# Patient Record
Sex: Female | Born: 1960
Health system: Southern US, Community
[De-identification: ages and names within clinical notes are randomized; demographics above are authoritative.]

## PROBLEM LIST (undated history)

## (undated) DIAGNOSIS — M419 Scoliosis, unspecified: Secondary | ICD-10-CM

## (undated) DIAGNOSIS — E785 Hyperlipidemia, unspecified: Secondary | ICD-10-CM

## (undated) DIAGNOSIS — E669 Obesity, unspecified: Secondary | ICD-10-CM

## (undated) HISTORY — DX: Hyperlipidemia, unspecified: E78.5

## (undated) HISTORY — DX: Obesity, unspecified: E66.9

## (undated) HISTORY — PX: CHOLECYSTECTOMY: SHX55

## (undated) HISTORY — DX: Scoliosis, unspecified: M41.9

---
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
## 2008-10-14 ENCOUNTER — Ambulatory Visit: Payer: Self-pay | Admitting: Cardiology

## 2009-01-14 ENCOUNTER — Ambulatory Visit: Payer: Self-pay | Admitting: Cardiology

## 2009-01-14 LAB — CONVERTED CEMR LAB
ALT: 29 units/L (ref 0–35)
AST: 29 units/L (ref 0–37)
Albumin: 4 g/dL (ref 3.5–5.2)
Cholesterol: 248 mg/dL (ref 0–200)
Hgb A1c MFr Bld: 7.2 % — ABNORMAL HIGH (ref 4.6–6.0)
Total Protein: 6.8 g/dL (ref 6.0–8.3)
Triglycerides: 1038 mg/dL (ref 0–149)

## 2009-01-20 ENCOUNTER — Ambulatory Visit: Payer: Self-pay | Admitting: Cardiology

## 2009-06-22 ENCOUNTER — Encounter (INDEPENDENT_AMBULATORY_CARE_PROVIDER_SITE_OTHER): Payer: Self-pay | Admitting: *Deleted

## 2009-07-23 ENCOUNTER — Ambulatory Visit: Payer: Self-pay | Admitting: Cardiology

## 2009-07-25 DIAGNOSIS — M4120 Other idiopathic scoliosis, site unspecified: Secondary | ICD-10-CM | POA: Insufficient documentation

## 2009-07-25 DIAGNOSIS — F17200 Nicotine dependence, unspecified, uncomplicated: Secondary | ICD-10-CM

## 2009-07-25 DIAGNOSIS — E669 Obesity, unspecified: Secondary | ICD-10-CM

## 2009-07-27 ENCOUNTER — Telehealth: Payer: Self-pay | Admitting: Cardiology

## 2009-07-27 LAB — CONVERTED CEMR LAB
ALT: 20 units/L (ref 0–35)
AST: 21 units/L (ref 0–37)
Alkaline Phosphatase: 65 units/L (ref 39–117)
Bilirubin, Direct: 0 mg/dL (ref 0.0–0.3)
Total Bilirubin: 0.8 mg/dL (ref 0.3–1.2)

## 2009-07-30 ENCOUNTER — Ambulatory Visit: Payer: Self-pay | Admitting: Cardiology

## 2009-07-30 DIAGNOSIS — E783 Hyperchylomicronemia: Secondary | ICD-10-CM

## 2009-09-03 ENCOUNTER — Ambulatory Visit: Payer: Self-pay | Admitting: Cardiology

## 2009-09-08 LAB — CONVERTED CEMR LAB
ALT: 21 units/L (ref 0–35)
AST: 23 units/L (ref 0–37)
Cholesterol: 195 mg/dL (ref 0–200)
Total Bilirubin: 0.6 mg/dL (ref 0.3–1.2)
Total CHOL/HDL Ratio: 8

## 2009-09-10 ENCOUNTER — Ambulatory Visit: Payer: Self-pay | Admitting: Cardiology

## 2009-12-24 ENCOUNTER — Encounter: Payer: Self-pay | Admitting: Cardiology

## 2010-03-14 ENCOUNTER — Telehealth: Payer: Self-pay | Admitting: Cardiology

## 2010-03-17 ENCOUNTER — Telehealth: Payer: Self-pay | Admitting: Cardiology

## 2010-03-18 ENCOUNTER — Ambulatory Visit: Payer: Self-pay | Admitting: Cardiology

## 2010-03-22 ENCOUNTER — Ambulatory Visit: Payer: Self-pay | Admitting: Cardiology

## 2010-03-22 DIAGNOSIS — IMO0001 Reserved for inherently not codable concepts without codable children: Secondary | ICD-10-CM | POA: Insufficient documentation

## 2010-03-22 DIAGNOSIS — E1165 Type 2 diabetes mellitus with hyperglycemia: Secondary | ICD-10-CM

## 2010-03-22 LAB — CONVERTED CEMR LAB
Albumin: 3.8 g/dL (ref 3.5–5.2)
Alkaline Phosphatase: 98 units/L (ref 39–117)
BUN: 8 mg/dL (ref 6–23)
Chloride: 103 meq/L (ref 96–112)
Cholesterol: 176 mg/dL (ref 0–200)
Direct LDL: 51.1 mg/dL
Potassium: 3.7 meq/L (ref 3.5–5.1)
Total CHOL/HDL Ratio: 6
Triglycerides: 902 mg/dL — ABNORMAL HIGH (ref 0.0–149.0)
VLDL: 180.4 mg/dL — ABNORMAL HIGH (ref 0.0–40.0)

## 2010-04-15 ENCOUNTER — Ambulatory Visit: Payer: Self-pay | Admitting: Internal Medicine

## 2010-04-15 DIAGNOSIS — Z87891 Personal history of nicotine dependence: Secondary | ICD-10-CM

## 2010-04-15 LAB — CONVERTED CEMR LAB
Creatinine,U: 131.9 mg/dL
Hgb A1c MFr Bld: 8.9 % — ABNORMAL HIGH (ref 4.6–6.5)
Microalb, Ur: 32.9 mg/dL — ABNORMAL HIGH (ref 0.0–1.9)

## 2010-06-28 ENCOUNTER — Telehealth: Payer: Self-pay | Admitting: Cardiology

## 2010-06-30 ENCOUNTER — Ambulatory Visit: Payer: Self-pay | Admitting: Cardiology

## 2010-06-30 DIAGNOSIS — E119 Type 2 diabetes mellitus without complications: Secondary | ICD-10-CM

## 2010-07-11 ENCOUNTER — Telehealth: Payer: Self-pay | Admitting: Internal Medicine

## 2010-08-05 ENCOUNTER — Ambulatory Visit: Payer: Self-pay | Admitting: Internal Medicine

## 2010-08-05 DIAGNOSIS — K219 Gastro-esophageal reflux disease without esophagitis: Secondary | ICD-10-CM | POA: Insufficient documentation

## 2010-08-19 ENCOUNTER — Encounter: Payer: Self-pay | Admitting: Internal Medicine

## 2010-09-23 ENCOUNTER — Ambulatory Visit: Payer: Self-pay | Admitting: Cardiology

## 2010-09-23 DIAGNOSIS — E109 Type 1 diabetes mellitus without complications: Secondary | ICD-10-CM | POA: Insufficient documentation

## 2010-09-23 DIAGNOSIS — E785 Hyperlipidemia, unspecified: Secondary | ICD-10-CM | POA: Insufficient documentation

## 2010-09-29 LAB — CONVERTED CEMR LAB
AST: 48 units/L — ABNORMAL HIGH (ref 0–37)
Alkaline Phosphatase: 75 units/L (ref 39–117)
Bilirubin, Direct: 0.1 mg/dL (ref 0.0–0.3)
Total Bilirubin: 0.5 mg/dL (ref 0.3–1.2)
Total CHOL/HDL Ratio: 7

## 2010-10-03 ENCOUNTER — Ambulatory Visit: Payer: Self-pay | Admitting: Cardiology

## 2010-10-17 ENCOUNTER — Telehealth: Payer: Self-pay | Admitting: Internal Medicine

## 2010-12-20 NOTE — Letter (Signed)
Summary: [facility]   Imported By: Lester [place] 08/23/2010 10:34:48  _____________________________________________________________________  External Attachment:    Type:   Image     Comment:   External Document

## 2010-12-20 NOTE — Progress Notes (Signed)
Summary: blood work prior to appt on 8/11   Phone Note Call from Patient Call back at [facility] Phone [phone]   Caller: Patient Reason for Call: Talk to Nurse Details for Reason: pt would like to have blood work prior to appt on 8/11. Initial call taken by: Lorne Skeens,  June 28, 2010 8:26 AM  Follow-up for Phone Call        left message on machine. Mylo Red RN     Appended Document: blood work prior to appt on 8/11  Reviewed Juanito Doom, MD

## 2010-12-20 NOTE — Assessment & Plan Note (Signed)
Summary: 3 MO ROV /NWS  #  RS' BUMP APPT 9-2 /NWS   Vital Signs:  Patient profile:   50 year old female Height:      62.5 inches (158.75 cm) Weight:      175.0 pounds (79.55 kg) O2 Sat:      98 % on Room air Temp:     97.9 degrees F (36.61 degrees C) oral Pulse rate:   77 / minute BP sitting:   112 / 66  (left arm) Cuff size:   regular  Vitals Entered By: Orlan Leavens RMA (August 05, 2010 2:32 PM)  O2 Flow:  Room air CC: 3 month follow-up Is Patient Diabetic? Yes Did you bring your meter with you today? No Pain Assessment Patient in pain? no        Primary Care Provider:  Newt Lukes MD  CC:  3 month follow-up.  History of Present Illness: here for followup  1) DM2 - reports compliance with ongoing medical treatment and no changes in medication dose or frequency. denies adverse side effects related to current therapy. tries to follow carb diet - no sig weight losss yet -  2) hyperlipidemia, esp hypertriglcerides - reports compliance with ongoing medical treatment and no changes in medication dose or frequency. denies adverse side effects related to current therapy.  follows with cards  3) hx scoli - s/p harrington rod surg 1977 - no daily back pain - does not follow with any local ortho for same   Contraindications/Deferment of Procedures/Staging:    Test/Procedure: FLU VAX    Reason for deferment: patient declined   Clinical Review Panels:  Lipid Management   Cholesterol:  176 (03/18/2010)   LDL (bad choesterol):  DEL (01/14/2009)   HDL (good cholesterol):  31.00 (03/18/2010)  Diabetes Management   HgBA1C:  8.9 (04/15/2010)   Creatinine:  0.7 (03/18/2010)   Current Medications (verified): 1)  Vitamin D 1000 Unit Tabs (Cholecalciferol) .Marland Kitchen.. 1 Tab Once Daily 2)  Aspirin 81 Mg Tbec (Aspirin) .Marland Kitchen.. 1 Tab Qd 3)  Fenofibrate 160 Mg Tabs (Fenofibrate) .Marland Kitchen.. 1 Tab Once Daily 4)  Probiotic .... As Directed 5)  Ra Fish Oil 1000 Mg Caps (Omega-3 Fatty Acids)  .Marland Kitchen.. 1 Two Times A Day 6)  Flax Seed Oil .Marland Kitchen.. 1 Teaspoon Once Daily 7)  Metformin Hcl 500 Mg Xr24h-Tab (Metformin Hcl) .... 2 By Mouth Two Times A Day 8)  Lisinopril 10 Mg Tabs (Lisinopril) .Marland Kitchen.. 1 By Mouth Once Daily 9)  Onetouch Ultra Test  Strp (Glucose Blood) .... Check Blood Sugars Two Times A Day 10)  Onetouch Lancets  Misc (Lancets) .... Use Two Times A Day  Allergies (verified): 1)  ! Prednisone  Past History:  Past Medical History: Diabetes mellitus, type 2 HYPERLIPIDEMIA-MIXED OBESITY SCOLIOSIS s/p remote harrington rod surg 1977  MD roster: cards - [facility] - [facility] - lowe ([facility])  Review of Systems  The patient denies fever, chest pain, syncope, and peripheral edema.    Physical Exam  General:  alert, well-developed, well-nourished, and cooperative to examination.    Lungs:  normal respiratory effort, no intercostal retractions or use of accessory muscles; normal breath sounds bilaterally - no crackles and no wheezes.    Heart:  normal rate, regular rhythm, no murmur, and no rub. BLE without edema.    Impression & Recommendations:  Problem # 1:  DIABETES MELLITUS, TYPE II (ICD-250.00)  check a1c - add second agent if still poor control Her updated  medication list for this problem includes:    Aspirin 81 Mg Tbec (Aspirin) .Marland Kitchen... 1 tab qd    Metformin Hcl 500 Mg Xr24h-tab (Metformin hcl) .Marland Kitchen... 2 by mouth two times a day    Lisinopril 10 Mg Tabs (Lisinopril) .Marland Kitchen... 1 by mouth once daily  Labs Reviewed: Creat: 0.7 (03/18/2010)    Reviewed HgBA1c results: 8.9 (04/15/2010)  7.2 (01/14/2009)  Orders: TLB-A1C / Hgb A1C (Glycohemoglobin) (83036-A1C) Ophthalmology Referral (Ophthalmology)  Problem # 2:  HYPERLIPIDEMIA TYPE I / IV (ICD-272.3)  Her updated medication list for this problem includes:    Fenofibrate 160 Mg Tabs (Fenofibrate) .Marland Kitchen... 1 tab once daily  cards asked her to add fish oil 1 g twice a day. She is doing the best she can with  her diet per her history. She does not drink alcohol. Her triglycerides in the past have been as high as 1500. I do not think she tolerate Niaspan and with her diabetes is contraindicated at this point.  Problem # 3:  GERD (ICD-530.81)  Her updated medication list for this problem includes:    Omeprazole 20 Mg Cpdr (Omeprazole) .Marland Kitchen... 1 by mouth once daily  Orders: Prescription Created Electronically [id number])  Complete Medication List: 1)  Vitamin D 1000 Unit Tabs (Cholecalciferol) .Marland Kitchen.. 1 tab once daily 2)  Aspirin 81 Mg Tbec (Aspirin) .Marland Kitchen.. 1 tab qd 3)  Fenofibrate 160 Mg Tabs (Fenofibrate) .Marland Kitchen.. 1 tab once daily 4)  Probiotic  .... As directed 5)  Ra Fish Oil 1000 Mg Caps (Omega-3 fatty acids) .Marland Kitchen.. 1 two times a day 6)  Flax Seed Oil  .Marland Kitchen.. 1 teaspoon once daily 7)  Metformin Hcl 500 Mg Xr24h-tab (Metformin hcl) .... 2 by mouth two times a day 8)  Lisinopril 10 Mg Tabs (Lisinopril) .Marland Kitchen.. 1 by mouth once daily 9)  Onetouch Ultra Test Strp (Glucose blood) .... Check blood sugars two times a day 10)  Onetouch Lancets Misc (Lancets) .... Use two times a day 11)  Omeprazole 20 Mg Cpdr (Omeprazole) .Marland Kitchen.. 1 by mouth once daily  Patient Instructions: 1)  it was good to see you today. 2)  test(s) ordered today - your results will be posted on the phone tree for review in 48-72 hours from the time of test completion; call [phone] and enter your 9 digit MRN (listed above on this page, just below your name); if any changes need to be made or there are abnormal results, you will be contacted directly.  3)  start omeprazole for heartburn - your prescription has been electronically submitted to your pharmacy. Please take as directed. Contact our office if you believe you're having problems with the medication(s).  4)  Please schedule a follow-up appointment in 3-4 months, sooner if problems.  5)  congrats on being done smoking! Prescriptions: OMEPRAZOLE 20 MG CPDR (OMEPRAZOLE) 1 by mouth once daily   #30 x 3   Entered and Authorized by:   Newt Lukes MD   Signed by:   Newt Lukes MD on 08/05/2010   Method used:   [facility] to        [facility]  [address] (retail[facility]       [address]       [place], [place]  [address]       Ph: [phone] or [phone]       Fax: [phone]   RxID:   [id number]

## 2010-12-20 NOTE — Procedures (Signed)
Summary: Colonoscopy  Colonoscopy   Imported By: Marylou Mccoy 02/01/2010 16:49:26  _____________________________________________________________________  External Attachment:    Type:   Image     Comment:   External Document

## 2010-12-20 NOTE — Assessment & Plan Note (Signed)
Summary: PER CHECK OUT/SF  Medications Added METFORMIN HCL 500 MG XR24H-TAB (METFORMIN HCL) 2 by mouth two times a day        Visit Type:  3 mo f/u Primary Provider:  Newt Lukes Deborah  CC:  no cardiac complaints today.  History of Present Illness: Deborah Wiggins comes in today for further evaluation of her severe mixed hyperlipidemia, obesity, tobacco use which she is now quit for 8 months, and type 2 diabetes.  Her total cholesterol 276, triglycerides were 902, HDL 31, VLDL 180.4 with minimally increased LFTs in April. Her hemoglobin A1c checked by primary care was 8.9% the end of May. He still in quite a bit of protein in her urine.  She is trying very hard to keep her weight stable. He is in difficult quitting smoking 8 months ago. She has not been exercising with the heat. She did not start fish oil.  Primary care increased her metformin to 500 mg twice a day and night.  Current Medications (verified): 1)  Vitamin D 1000 Unit Tabs (Cholecalciferol) .Marland Kitchen.. 1 Tab Once Daily 2)  Aspirin 81 Mg Tbec (Aspirin) .Marland Kitchen.. 1 Tab Qd 3)  Fenofibrate 160 Mg Tabs (Fenofibrate) .Marland Kitchen.. 1 Tab Once Daily 4)  Probiotic .... As Directed 5)  Ra Fish Oil 1000 Mg Caps (Omega-3 Fatty Acids) .Marland Kitchen.. 1 Two Times A Day 6)  Flax Seed Oil .Marland Kitchen.. 1 Teaspoon Once Daily 7)  Metformin Hcl 500 Mg Xr24h-Tab (Metformin Hcl) .Marland Kitchen.. 1 By Mouth Two Times A Day 8)  Lisinopril 10 Mg Tabs (Lisinopril) .Marland Kitchen.. 1 By Mouth Once Daily 9)  Onetouch Ultra Test  Strp (Glucose Blood) .... Check Blood Sugars Two Times A Day 10)  Onetouch Lancets  Misc (Lancets) .... Use Two Times A Day  Allergies: 1)  ! Prednisone  Past History:  Past Medical History: Last updated: 04/15/2010 Diabetes mellitus, type 2 HYPERLIPIDEMIA-MIXED (ICD-272.4) OBESITY (ICD-278.00) SCOLIOSIS s/p remote harrington rod surg 1977  [facility] - Deborah Wiggins (Deborah Wiggins)  Past Surgical History: Last updated:  04/15/2010 Appendectomy Cholecystectomy    Family History: Last updated: 04/15/2010 Family History of Coronary Artery Disease: / Father dad expired age 46y - CAD mom - age 68s - dementia, mild-mod  Social History: Last updated: 04/15/2010 Full Time, employed as Designer, industrial/product at [employer] Married, lives with spouse and dogs Alcohol Use - no Regular Exercise - no Drug Use - no Former Smoker, quit 06/2009  Risk Factors: Alcohol Use: 0 (04/15/2010) Exercise: no (10/14/2008)  Risk Factors: Smoking Status: quit < 6 months (04/15/2010)  Review of Systems       negative other than history of present illness  Vital Signs:  Patient profile:   50 year old female Height:      62.5 inches Weight:      176 pounds BMI:     31.79 Pulse rate:   79 / minute Pulse rhythm:   irregular BP sitting:   114 / 70  (left arm) Cuff size:   large  Vitals Entered By: Deborah Wiggins, CMA (June 30, 2010 4:34 PM)  Physical Exam  General:  obese.  humerous, no acute distress Head:  normocephalic and atraumatic Eyes:  PERRLA/EOM intact; conjunctiva and lids normal. Neck:  Neck supple, no JVD. No masses, thyromegaly or abnormal cervical nodes. Chest Deborah Wiggins:  no deformities or breast masses noted Lungs:  Clear bilaterally to auscultation and percussion. Heart:  PMI is normal, normal S1-S2, regular rate  and rhythm carotid upstrokes equal bilaterally without bruits Msk:  Back normal, normal gait. Muscle strength and tone normal. Pulses:  pulses normal in all 4 extremities Extremities:  No clubbing or cyanosis. Neurologic:  Alert and oriented x 3. Skin:  Intact without lesions or rashes. Psych:  Normal affect.   EKG  Procedure date:  06/30/2010  Findings:      normal sinus rhythm, no change.  Impression & Recommendations:  Problem # 1:  OBESITY (ICD-278.00) Assessment Unchanged I believe she is doing the best she can with her diet. Her weight has fluctuated very little even though  she quit smoking 8 months ago. She will start exercising when it cools off.  Problem # 2:  TOBACCO USE, QUIT (ICD-V15.82)  Problem # 3:  HYPERLIPIDEMIA TYPE I / IV (ICD-272.3) I have asked her to add fish oil 1 g twice a day. She is doing the best she can with her diet per her history. She does not drink alcohol. Her triglycerides in the past have been as high as 1500. I do not think she tolerate Niaspan and with her diabetes is contraindicated at this point. Her updated medication list for this problem includes:    Fenofibrate 160 Mg Tabs (Fenofibrate) .Marland Kitchen... 1 tab once daily  Orders: EKG w/ Interpretation ([address])  Problem # 4:  CORONARY ARTERY DISEASE, FAMILY HX (ICD-V17.3) Assessment: Unchanged  Orders: EKG w/ Interpretation ([address])  Problem # 5:  DIABETES MELLITUS, TYPE II (ICD-250.00) Deborah Wiggins increased her metformin to 500 mg twice a day end of May. I will take the liberty of increasing her to 1000 mg twice a day and ask her to followup with blood work in 3 months. I will send a note to Deborah Wiggins. Her updated medication list for this problem includes:    Aspirin 81 Mg Tbec (Aspirin) .Marland Kitchen... 1 tab qd    Metformin Hcl 500 Mg Xr24h-tab (Metformin hcl) .Marland Kitchen... 1 by mouth two times a day    Lisinopril 10 Mg Tabs (Lisinopril) .Marland Kitchen... 1 by mouth once daily  Patient Instructions: 1)  Your physician recommends that you schedule a follow-up appointment in: 3 months with Deborah Wiggins 2)  Your physician recommends that you return for a FASTING lipid profile, liver function test in 3 months 3)  Your physician recommends that you continue on your current medications as directed. Please refer to the Current Medication list given to you today. Metformin increased Prescriptions: METFORMIN HCL 500 MG XR24H-TAB (METFORMIN HCL) 2 by mouth two times a day  #120 x 6   Entered by:   Deborah Devoid RN   Authorized by:   Deborah Shih, Deborah, [facility]   Signed by:   Deborah Devoid RN on 06/30/2010   Method used:    [facility]        [facility]  [address] (retail)       [address]       [place], [place]  [address]       Ph: [phone] or [phone]       Fax: [phone]   RxID:   [id number]

## 2010-12-20 NOTE — Progress Notes (Signed)
Summary: lab work   Phone Note Call from Patient Call back at Work Phone [phone] Call back at [phone] or [phone]   Caller: Patient Summary of Call: request call back about lab work before appt on Monday Initial call taken by: Migdalia Dk,  March 17, 2010 11:23 AM  Follow-up for Phone Call        Phone Call Completed PT COMING IN 03/18/10 AT 8:30 FOR LABS  Follow-up by: Scherrie Bateman, LPN,  March 17, 2010 11:50 AM

## 2010-12-20 NOTE — Assessment & Plan Note (Signed)
Summary: NEW [facility] PT--DM II-NEED A PRIM CARE MD-# PER [facility]/DR WALL-PK...   Vital Signs:  Patient profile:   50 year old female Height:      62.5 inches (158.75 cm) Weight:      173.0 pounds (78.64 kg) O2 Sat:      96 % on Room air Temp:     98.0 degrees F (36.67 degrees C) oral Pulse rate:   92 / minute BP sitting:   138 / 100  (left arm) Cuff size:   regular  Vitals Entered By: Orlan Leavens (Apr 15, 2010 2:08 PM)  O2 Flow:  Room air CC: New patient/ Pt ? sinus infection, URI symptoms Is Patient Diabetic? No Pain Assessment Patient in pain? no        Primary Care Provider:  Newt Lukes MD  CC:  New patient/ Pt ? sinus infection and URI symptoms.  History of Present Illness: new pt to me and our practice - here to est care -  1) DM2 - never on meds for same, recently begun on low carb diet - no sig weight losss yet - has never checked sugars - no PU/PD  2) hyperlipidemia - reports compliance with ongoing medical treatment and no changes in medication dose or frequency. denies adverse side effects related to current therapy.  follows with cards  3) hx scoli - s/p harrington rod surg 1977 - no daily back pain - does not follow with any local ortho for same  also c/o ?sinus infx  URI Symptoms      This is a 50 year old woman who presents with URI symptoms.  The symptoms began 5 days ago.  The severity is described as moderate.  The patient reports nasal congestion, purulent nasal discharge, and sick contacts, but denies sore throat, dry cough, productive cough, and earache.  Associated symptoms include low-grade fever (<100.5 degrees) and use of an antipyretic.  The patient denies stiff neck, dyspnea, wheezing, rash, vomiting, and diarrhea.  The patient also reports headache and severe fatigue.  The patient denies sneezing, seasonal symptoms, and response to antihistamine.  The patient denies the following risk factors for Strep sinusitis: unilateral facial pain,  unilateral nasal discharge, tooth pain, and Strep exposure.    Preventive Screening-Counseling & Management  Alcohol-Tobacco     Alcohol drinks/day: 0     Smoking Status: quit < 6 months     Year Quit: 2011     Tobacco Counseling: not to resume use of tobacco products  Caffeine-Diet-Exercise     Diet Counseling: to improve diet; diet is suboptimal     Nutrition Referrals: yes     Exercise Counseling: to improve exercise regimen     Depression Counseling: not indicated; screening negative for depression  Safety-Violence-Falls     Seat Belt Counseling: not indicated; patient wears seat belts     Helmet Counseling: not indicated; patient wears helmet when riding bicycle/motocycle     Firearms in the Home: no firearms in the home     Firearm Counseling: not applicable     Smoke Detector Counseling: no     Violence Counseling: not indicated; no violence risk noted     Fall Risk Counseling: not indicated; no significant falls noted  Clinical Review Panels:  Lipid Management   Cholesterol:  176 (03/18/2010)   LDL (bad choesterol):  DEL (01/14/2009)   HDL (good cholesterol):  31.00 (03/18/2010)  Diabetes Management   HgBA1C:  7.2 (01/14/2009)   Creatinine:  0.7 (  03/18/2010)  Complete Metabolic Panel   Glucose:  234 (03/18/2010)   Sodium:  136 (03/18/2010)   Potassium:  3.7 (03/18/2010)   Chloride:  103 (03/18/2010)   CO2:  24 (03/18/2010)   BUN:  8 (03/18/2010)   Creatinine:  0.7 (03/18/2010)   Albumin:  3.8 (03/18/2010)   Total Protein:  6.7 (03/18/2010)   Calcium:  9.1 (03/18/2010)   Total Bili:  0.5 (03/18/2010)   Alk Phos:  98 (03/18/2010)   SGPT (ALT):  38 (03/18/2010)   SGOT (AST):  43 (03/18/2010)   Current Medications (verified): 1)  Vitamin D 1000 Unit Tabs (Cholecalciferol) .Marland Kitchen.. 1 Tab Once Daily 2)  Aspirin 81 Mg Tbec (Aspirin) .Marland Kitchen.. 1 Tab Qd 3)  Fenofibrate 160 Mg Tabs (Fenofibrate) .Marland Kitchen.. 1 Tab Once Daily 4)  Probiotic .... As Directed 5)  Ra Fish Oil 1000  Mg Caps (Omega-3 Fatty Acids) .Marland Kitchen.. 1 Two Times A Day 6)  Flax Seed Oil .Marland Kitchen.. 1 Teaspoon Once Daily  Allergies (verified): 1)  ! Prednisone  Past History:  Past Medical History: Diabetes mellitus, type 2 HYPERLIPIDEMIA-MIXED (ICD-272.4) OBESITY (ICD-278.00) SCOLIOSIS s/p remote harrington rod surg 1977  MD rooster: cards - [facility] - [facility] - [facility] ([facility])  Past Surgical History: Appendectomy Cholecystectomy    Family History: Family History of Coronary Artery Disease: / Father dad expired age 80y - CAD mom - age 92s - dementia, mild-mod  Social History: Full Time, employed as Designer, industrial/product at [employer] Married, lives with spouse and dogs Alcohol Use - no Regular Exercise - no Drug Use - no Former Smoker, quit 06/2009 Smoking Status:  quit < 6 months  Review of Systems       see HPI above. I have reviewed all other systems and they were negative.   Physical Exam  General:  alert, well-developed, well-nourished, and cooperative to examination.    Head:  Normocephalic and atraumatic without obvious abnormalities. No apparent alopecia or balding. Eyes:  vision grossly intact; pupils equal, round and reactive to light.  conjunctiva and lids normal.    Ears:  normal pinnae bilaterally, without erythema, swelling, or tenderness to palpation. TMs hazy, with mild clear effusion, or cerumen impaction. Hearing grossly normal bilaterally  Mouth:  teeth and gums in good repair; mucous membranes moist, without lesions or ulcers. oropharynx clear without exudate, mod erythema. +PND  Neck:  supple, full ROM, no masses, no thyromegaly; no thyroid nodules or tenderness. no JVD or carotid bruits.   Lungs:  normal respiratory effort, no intercostal retractions or use of accessory muscles; normal breath sounds bilaterally - no crackles and no wheezes.    Heart:  normal rate, regular rhythm, no murmur, and no rub. BLE without edema. normal DP pulses and normal cap  refill in all 4 extremities    Abdomen:  soft, non-tender, normal bowel sounds, no distention; no masses and no appreciable hepatomegaly or splenomegaly.   Msk:  s/p harrington rod scoli surg (remote) Neurologic:  alert & oriented X3 and cranial nerves II-XII symetrically intact.  strength normal in all extremities, sensation intact to light touch, and gait normal. speech fluent without dysarthria or aphasia; follows commands with good comprehension.  Skin:  no rashes, vesicles, ulcers, or erythema. No nodules or irregularity to palpation.  Psych:  Oriented X3, memory intact for recent and remote, normally interactive, good eye contact, not anxious appearing, not depressed appearing, and not agitated.      Impression & Recommendations:  Problem # 1:  DIABETES MELLITUS, TYPE II, UNCONTROLLED (ICD-250.02)  practically new dx as never been medically addressed before - recheck a1c now but expect warrants med tx as last a1c >7 metformin, ACEI and DM education referral - done will need eye and foot eval - arrange next OV if not already done by pt  given onetouch ultra glucometer machine, strips and educated on use - to check two times a day for now Time spent with patient 45 minutes, more than 50% of this time was spent counseling patient on diabetes and med + diet + exercise mgmt of same; also on tx of sinus infx   Her updated medication list for this problem includes:    Aspirin 81 Mg Tbec (Aspirin) .Marland Kitchen... 1 tab qd    Metformin Hcl 500 Mg Xr24h-tab (Metformin hcl) .Marland Kitchen... 1 by mouth  every morning    Lisinopril 10 Mg Tabs (Lisinopril) .Marland Kitchen... 1 by mouth once daily  Orders: TLB-A1C / Hgb A1C (Glycohemoglobin) (83036-A1C) TLB-Microalbumin/Creat Ratio, Urine (82043-MALB) Nutrition Referral (Nutrition) Prescription Created Electronically [id number])  Labs Reviewed: Creat: 0.7 (03/18/2010)    Reviewed HgBA1c results: 7.2 (01/14/2009)  Problem # 2:  HYPERLIPIDEMIA TYPE I / IV (ICD-272.3)  Her updated  medication list for this problem includes:    Fenofibrate 160 Mg Tabs (Fenofibrate) .Marland Kitchen... 1 tab once daily per cards: TG dangerously high for pancreatitis and her VLDL is 107.4.2 dietary indiscretion. She admits to loving carbs which we reviewed extensively today. Her total cholesterol and LDL are fairly good. She states that she is compliant with her medication. Her weight however is up 11 pounds. Some of this may be related to not smoking.  I strongly rep commended that she eliminate carbs and lose between 25 and 30 pounds in about a 4-6 pound weight loss per month Her updated medication list for this problem includes:    Fenofibrate 160 Mg Tabs (Fenofibrate) .Marland Kitchen... 1 tab once daily  Labs Reviewed: SGOT: 43 (03/18/2010)   SGPT: 38 (03/18/2010)   HDL:31.00 (03/18/2010), 24.40 (09/03/2009)  LDL:DEL (01/14/2009)  Chol:176 (03/18/2010), 195 (09/03/2009)  Trig:902.0 (03/18/2010), 537.0 (09/03/2009)  Problem # 3:  SINUSITIS, ACUTE (ICD-461.9)  Her updated medication list for this problem includes:    Azithromycin 250 Mg Tabs (Azithromycin) .Marland Kitchen... 2 tabs by mouth today, then 1 by mouth daily starting tomorrow  Instructed on treatment. Call if symptoms persist or worsen.   Orders: Prescription Created Electronically [id number])  Problem # 4:  SCOLIOSIS (ICD-737.30) remote harrington rod surg repair (1977) - no pain symptoms or daily complaints  Complete Medication List: 1)  Vitamin D 1000 Unit Tabs (Cholecalciferol) .Marland Kitchen.. 1 tab once daily 2)  Aspirin 81 Mg Tbec (Aspirin) .Marland Kitchen.. 1 tab qd 3)  Fenofibrate 160 Mg Tabs (Fenofibrate) .Marland Kitchen.. 1 tab once daily 4)  Probiotic  .... As directed 5)  Ra Fish Oil 1000 Mg Caps (Omega-3 fatty acids) .Marland Kitchen.. 1 two times a day 6)  Flax Seed Oil  .Marland Kitchen.. 1 teaspoon once daily 7)  Metformin Hcl 500 Mg Xr24h-tab (Metformin hcl) .Marland Kitchen.. 1 by mouth  every morning 8)  Lisinopril 10 Mg Tabs (Lisinopril) .Marland Kitchen.. 1 by mouth once daily 9)  Azithromycin 250 Mg Tabs (Azithromycin) .... 2  tabs by mouth today, then 1 by mouth daily starting tomorrow 10)  Onetouch Ultra Test Strp (Glucose blood) .... Check blood sugars two times a day 11)  Onetouch Lancets Misc (Lancets) .... Use two times a day  Patient Instructions: 1)  it was good to see you today. 2)  test(s) ordered today - your results will be posted on the phone tree for review in 48-72 hours from the time of test completion; call [phone] and enter your 9 digit MRN (listed above on this page, just below your name); if any changes need to be made or there are abnormal results, you will be contacted directly.  3)  start metformin and lisinopril for diabetes - check sugar 2x/day and keep log of these readings - call if sugar over 200 or less than 70. 4)  Zpack for your sinus infection 5)  your prescriptions have been electronically submitted to your pharmacy. Please take as directed. Contact our office if you believe you're having problems with the medication(s).  6)  will also make referral to diabetic eduacator. Our office will contact you regarding this appointment once made.  7)  Please schedule a follow-up appointment in 3 months to continue review, sooner if problems.  Prescriptions: ONETOUCH LANCETS  MISC (LANCETS) use two times a day  #60 x 5   Entered by:   Orlan Leavens   Authorized by:   Newt Lukes MD   Signed by:   Orlan Leavens on 04/15/2010   Method used:   Electron[facility] to        [facility] (retail)       [address]       [place], [place]  [address]       Ph: [phone] or [phone]       Fax: [phone]   RxID:   [id number] ONETOUCH ULTRA TEST  STRP (GLUCOSE BLOOD) check blood sugars two times a day  #60 x 5   Entered by:   Orlan Leavens   Authorized by:   Newt Lukes MD   Signed by:   Orlan Leavens on 04/15/2010   Method used:   [facility]        [facility] [id number] ([facility]ail[facility]       [address]       [place], [place]  [address]       Ph:  [phone] or [phone]       Fax: [phone]   RxID:   [id number] LISINOPRIL 10 MG TABS (LISINOPRIL) 1 by mouth once daily  #30 x 4   Entered and Authorized by:   Newt Lukes MD   Signed by:   Newt Lukes MD on 04/15/2010   Method used:   [facility]        [facility] [id number]       [address]       [place], [place]  [address]       Ph: [phone] or [phone]       Fax: [phone]   RxID:   [id number] AZITHROMYCIN 250 MG TABS (AZITHROMYCIN) 2 tabs by mouth today, then 1 by mouth daily starting tomorrow  #6 x 0   Entered and Authorized by:   Newt Lukes MD   Signed by:   Newt Lukes MD on 04/15/2010   Method used:   [facility]        [facility] (retail)       [address]       [place], [place]  [address]       Ph: [phone] or [phone]       Fax: [phone]   RxID:   [id number] METFORMIN HCL 500 MG XR24H-TAB (METFORMIN HCL) 1 by mouth  every  morning  #30 x 4   Entered and Authorized by:   Newt Lukes MD   Signed by:   Newt Lukes MD on 04/15/2010   Method used:   [facility]        [facility] (retail)       [address]       [place], [place]  [address]       Ph: [phone] or [phone]       Fax: [phone]   RxID:   [id number]

## 2010-12-20 NOTE — Progress Notes (Signed)
Summary: lisinopril & strips  Phone Note Refill Request Message from:  Fax from Pharmacy on July 11, 2010 8:22 AM  Refills Requested: Medication #1:  ONETOUCH ULTRA TEST  STRP check blood sugars two times a day [facility] [phone]   Method Requested: Fax to Mail Away Pharmacy Initial call taken by: Orlan Leavens RMA,  July 11, 2010 8:22 AM    Prescriptions: LISINOPRIL 10 MG TABS (LISINOPRIL) 1 by mouth once daily  #90 x 2   Entered by:   Orlan Leavens RMA   Authorized by:   Newt Lukes MD   Signed by:   Orlan Leavens RMA on 07/11/2010   Method used:   Faxed to ...       [facility] (retail)             ,          Ph: [phone]       Fax: [phone]   RxID:   [id number] ONETOUCH ULTRA TEST  STRP (GLUCOSE BLOOD) check blood sugars two times a day  #90 day suppl x 2   Entered by:   Orlan Leavens RMA   Authorized by:   Newt Lukes MD   Signed by:   Orlan Leavens RMA on 07/11/2010   Method used:   Faxed to ...       [facility] (retail)             ,          Ph: [phone]       Fax: [phone]   RxID:   [id number]

## 2010-12-20 NOTE — Progress Notes (Signed)
Summary: metfomin  Phone Note Refill Request Message from:  Fax from Pharmacy on October 17, 2010 2:32 PM  Refills Requested: Medication #1:  METFORMIN HCL 500 MG XR24H-TAB 2 by mouth two times a day [facility] [phone]   Method Requested: Fax to Mail Away Pharmacy Initial call taken by: Orlan Leavens RMA,  October 17, 2010 2:32 PM    Prescriptions: METFORMIN HCL 500 MG XR24H-TAB (METFORMIN HCL) 2 by mouth two times a day  #360 x 1   Entered by:   Orlan Leavens RMA   Authorized by:   Newt Lukes MD   Signed by:   Orlan Leavens RMA on 10/17/2010   Method used:   Faxed to ...       [facility] (retail)             ,          Ph: [phone]       Fax: [phone]   RxID:   [id number]

## 2010-12-20 NOTE — Assessment & Plan Note (Signed)
Summary: f44m    Visit Type:  Follow-up Primary Provider:  no PCP at this time  CC:  c/o nauseated all time fatigue quit smoking.  History of Present Illness: Deborah Wiggins comes in today because of deterioration in her mixed hyperlipidemia particular triglycerides and VLDL. Her last fasting panel showed a total triglyceride count of 902 with a VLDL of 180.4. Her AST and ALT were mildly elevated as well. Her blood sugar now is high at 234. Note that this was fasting.  She quit smoking 4 months ago. She admits to dietary indiscretion particularly carbohydrates. Her weight is up 11 pounds.  She denies a polydipsia, polyuria, or nocturia. He does have some off and on nausea. She denies abdominal pain or change in her bowel habits.  Current Medications (verified): 1)  Vitamin D 1000 Unit Tabs (Cholecalciferol) .Marland Kitchen.. 1 Tab Once Daily 2)  Aspirin 81 Mg Tbec (Aspirin) .Marland Kitchen.. 1 Tab Qd 3)  Fenofibrate 160 Mg Tabs (Fenofibrate) .Marland Kitchen.. 1 Tab Once Daily 4)  Probiotic .... As Directed 5)  Ra Fish Oil 1000 Mg Caps (Omega-3 Fatty Acids) .Marland Kitchen.. 1 Two Times A Day  Allergies: 1)  ! Prednisone  Past History:  Past Medical History: Last updated: 07/25/2009 CORONARY ARTERY DISEASE, FAMILY HX (ICD-V17.3) HYPERLIPIDEMIA-MIXED (ICD-272.4) TOBACCO USER (ICD-305.1) OBESITY (ICD-278.00) SCOLIOSIS (ICD-737.30) LONG-TERM USE OF ASPIRIN (ICD-V58.66)    Past Surgical History: Last updated: 10/14/2008 Appendectomy Cholecystectomy  Family History: Last updated: 10/14/2008 Family History of Coronary Artery Disease: / Father  Social History: Last updated: 10/14/2008 Full Time Married  Tobacco Use - Yes.  Alcohol Use - no Regular Exercise - no Drug Use - no  Risk Factors: Exercise: no (10/14/2008)  Risk Factors: Smoking Status: current (10/14/2008)  Review of Systems       negative other than history of present illness  Vital Signs:  Patient profile:   50 year old female Height:      62.5  inches Weight:      175 pounds BMI:     31.61 Pulse rate:   76 / minute Pulse rhythm:   regular BP sitting:   158 / 88  (left arm) Cuff size:   large  Physical Exam  General:  obese.  obese.   Head:  normocephalic and atraumatic Eyes:  PERRLA/EOM intact; conjunctiva and lids normal. Neck:  Neck supple, no JVD. No masses, thyromegaly or abnormal cervical nodes. Chest Deborah Wiggins:  no deformities or breast masses noted Lungs:  Clear bilaterally to auscultation and percussion. Heart:  Non-displaced PMI, chest non-tender; regular rate and rhythm, S1, S2 without murmurs, rubs or gallops. Carotid upstroke normal, no bruit. Normal abdominal aortic size, no bruits. Femorals normal pulses, no bruits. Pedals normal pulses. No edema, no varicosities. Msk:  Back normal, normal gait. Muscle strength and tone normal. Pulses:  pulses normal in all 4 extremities Extremities:  No clubbing or cyanosis. Neurologic:  Alert and oriented x 3. Skin:  Intact without lesions or rashes. Psych:  Normal affect.   Impression & Recommendations:  Problem # 1:  HYPERLIPIDEMIA TYPE I / IV (ICD-272.3) Assessment Deteriorated Her was arise R. dangerously high for pancreatitis and her VLDL is 107.4.2 dietary indiscretion. She admits to loving carbs which we reviewed extensively today. Her total cholesterol and LDL are fairly good. She states that she is compliant with her medication. Her weight however is up 11 pounds. Some of this may be related to not smoking.  Her blood sugars now elevated which was fasting. She is clearly  a new onset diabetic as well.  I strongly rep commended that she eliminate carbs and lose between 25 and 30 pounds in about a 4-6 pound weight loss per month. I'll arrange for her to see Dr.Leshbar of primary care for this problem. Her updated medication list for this problem includes:    Fenofibrate 160 Mg Tabs (Fenofibrate) .Marland Kitchen... 1 tab once daily  Problem # 2:  CORONARY ARTERY DISEASE, FAMILY HX  (ICD-V17.3) Assessment: Unchanged  Problem # 3:  TOBACCO USER (ICD-305.1) Assessment: Improved she has quit x4 months.  Problem # 4:  OBESITY (ICD-278.00) Assessment: Deteriorated She needs to lose 25-30 pounds initially which may normalize her blood sugar and lower triglycerides well below 500.  Problem # 5:  DIABETES MELLITUS, TYPE II, UNCONTROLLED (ICD-250.02)  See the above Her updated medication list for this problem includes:    Aspirin 81 Mg Tbec (Aspirin) .Marland Kitchen... 1 tab qd  Her updated medication list for this problem includes:    Aspirin 81 Mg Tbec (Aspirin) .Marland Kitchen... 1 tab qd  Orders: Misc. Referral (Misc. Ref)  Patient Instructions: 1)  Your physician recommends that you schedule a follow-up appointment in: 3 MONTHS WITH DR Alicianna Litchford 2)  Your physician recommends that you continue on your current medications as directed. Please refer to the Current Medication list given to you today. 3)  You have been referred to PMD DR LESCHBER DX DM 2

## 2010-12-20 NOTE — Progress Notes (Signed)
Summary: lab work prior to appt   Phone Note Call from Patient Call back at [facility] Phone [phone] Call back at [phone]   Caller: Patient Reason for Call: Talk to Nurse Summary of Call: pt wants to have blood work prior to appt in may.  Initial call taken by: Lorne Skeens,  March 14, 2010 10:19 AM  Follow-up for Phone Call        ok Follow-up by: Gaylord Shih, MD, [facility],  March 15, 2010 9:51 AM

## 2010-12-20 NOTE — Assessment & Plan Note (Signed)
Summary: per check out/sf  Medications Added CINNAMON 500 MG CAPS (CINNAMON) 1 cap two times a day        Visit Type:  3 mo f/u Primary Provider:  Newt Lukes MD  CC:  denies any cardiac complaints today.  History of Present Illness: Deborah Wiggins returns today for severe mixed hyperlipidemia.  We added visual 2 g a day. She is on maximum fenofibrate. Her blood sugars have not been under good control. She has not lost any weight. Her last cholesterol was 203, triglycerides 1106, HDL 30.8, VLDL to 21.2. Her direct LDL is 48.7. LFTs showed a slight elevation of AST and ALT  She denies abdominal pain, nausea, vomiting or diarrhea.  She tells me she took vitamin B12 shots in the past as seen onYou Tube. at that time, she lost very little weight but her triglycerides came down to 500. She says the reason they are high and now is because she's been taking wheat germ.  Current Medications (verified): 1)  Vitamin D 1000 Unit Tabs (Cholecalciferol) .Marland Kitchen.. 1 Tab Once Daily 2)  Aspirin 81 Mg Tbec (Aspirin) .Marland Kitchen.. 1 Tab Qd 3)  Fenofibrate 160 Mg Tabs (Fenofibrate) .Marland Kitchen.. 1 Tab Once Daily 4)  Ra Fish Oil 1000 Mg Caps (Omega-3 Fatty Acids) .Marland Kitchen.. 1 Two Times A Day 5)  Metformin Hcl 500 Mg Xr24h-Tab (Metformin Hcl) .... 2 By Mouth Two Times A Day 6)  Lisinopril 10 Mg Tabs (Lisinopril) .Marland Kitchen.. 1 By Mouth Once Daily 7)  Onetouch Ultra Test  Strp (Glucose Blood) .... Check Blood Sugars Two Times A Day 8)  Onetouch Lancets  Misc (Lancets) .... Use Two Times A Day 9)  Omeprazole 20 Mg Cpdr (Omeprazole) .Marland Kitchen.. 1 By Mouth Once Daily 10)  Cinnamon 500 Mg Caps (Cinnamon) .Marland Kitchen.. 1 Cap Two Times A Day  Allergies: 1)  ! Prednisone  Past History:  Past Medical History: Last updated: 08/05/2010 Diabetes mellitus, type 2 HYPERLIPIDEMIA-MIXED OBESITY SCOLIOSIS s/p remote harrington rod surg 1977  MD roster: cards - Dale[facility] Squib[facility] - [facility] - [facility] ([facility])  Past Surgical History: Last updated:  04/15/2010 Appendectomy Cholecystectomy    Family History: Last updated: 04/15/2010 Family History of Coronary Artery Disease: / Father dad expired age 97y - CAD mom - age 24s - dementia, mild-mod  Social History: Last updated: 04/15/2010 Full Time, employed as Designer, industrial/product at [employer] Married, lives with spouse and dogs Alcohol Use - no Regular Exercise - no Drug Use - no Former Smoker, quit 06/2009  Risk Factors: Alcohol Use: 0 (04/15/2010) Exercise: no (10/14/2008)  Risk Factors: Smoking Status: quit < 6 months (04/15/2010)  Review of Systems       negative other than history of present illness  Vital Signs:  Patient profile:   50 year old female Height:      62.5 inches Weight:      175.8 pounds BMI:     31.76 Pulse rate:   82 / minute Pulse rhythm:   irregular BP sitting:   138 / 84  (left arm) Cuff size:   large  Vitals Entered By: Danielle Rankin, CMA (October 03, 2010 4:29 PM)  Physical Exam  General:  obese.  no acute distress Head:  normocephalic and atraumatic Eyes:  PERRLA/EOM intact; conjunctiva and lids normal. Neck:  Neck supple, no JVD. No masses, thyromegaly or abnormal cervical nodes. Chest Wall:  no deformities or breast masses noted Lungs:  Clear bilaterally to auscultation and percussion. Heart:  regular  rate and rhythm, normal S1-S2, no bruit Abdomen:  soft, nontender, good bowel sounds Msk:  Back normal, normal gait. Muscle strength and tone normal. Pulses:  pulses normal in all 4 extremities Extremities:  No clubbing or cyanosis. Neurologic:  Alert and oriented x 3. Skin:  Intact without lesions or rashes. Psych:  Normal affect.   Impression & Recommendations:  Problem # 1:  HYPERLIPIDEMIA TYPE I / IV (ICD-272.3) I have strongly suggested she lose at least 25-30 pounds, restrict carbohydrates as much as possible, drink plenty of fluids, and tighter blood sugar up. It had these discussions now numerous times in the past. She  is convinced that vitamin B12 would solve the problem. I tried to reason with her that this is not the case but I am not getting very far.  When she loses 25-30 pounds, blood sugar is tight, we'll repeat her labs. In the meantime we will hope that she did not develop any pancreatitis. I do not want to use niacin. A statin we'll not add very much nor any other hyperlipidemic drugs. Offered to set her up in the lipid clinic for intense education about diet and followup. She would like to wait on this. Her updated medication list for this problem includes:    Fenofibrate 160 Mg Tabs (Fenofibrate) .Marland Kitchen... 1 tab once daily  Problem # 2:  DIABETES MELLITUS, TYPE II, UNCONTROLLED (ICD-250.02) Assessment: Unchanged  Her updated medication list for this problem includes:    Aspirin 81 Mg Tbec (Aspirin) .Marland Kitchen... 1 tab qd    Metformin Hcl 500 Mg Xr24h-tab (Metformin hcl) .Marland Kitchen... 2 by mouth two times a day    Lisinopril 10 Mg Tabs (Lisinopril) .Marland Kitchen... 1 by mouth once daily  Problem # 3:  OBESITY (ICD-278.00) Assessment: Unchanged  Patient Instructions: 1)  Your physician recommends that you schedule a follow-up appointment in: 1 year with Dr. Daleen Squibb 2)  Your physician recommends that you continue on your current medications as directed. Please refer to the Current Medication list given to you today. 3)  Your physician recommends a low cholesterol, low fat diet. Please see [facility] handout. 4)  Your physician encouraged you to lose weight for better health. Dr. Daleen Squibb recommends that you lose 4 pounds per month for a total of 25-30 pounds.   5)  Remember to drink plenty of fluids.  Restrict your carbohydrates for better glucose control and weight control.

## 2011-04-04 NOTE — Assessment & Plan Note (Signed)
[facility]                            CARDIOLOGY OFFICE NOTE   Deborah, Wiggins                         MRN:          [id number]  DATE:10/14/2008                            DOB:          [birth year]    HISTORY OF PRESENT ILLNESS:  I was asked by Dr. Rana Snare to consult on  Deborah Wiggins with mixed hyperlipidemia.   HISTORY OF PRESENT ILLNESS:  She is 50 years of age, married white  female, a mother of two.  She has been told that she has high  triglycerides for at least 15 years.  Someone put her on gemfibrozil in  the past, but she said it did not bring her triglycerides down.  It is  not clear how long she took it or what the dose was.   She has multiple cardiac risk factors including tobacco use of one-pack  per day for 30 years, obesity, and sedentary lifestyle.  Family history  with a father having heart disease in his early 11s and dying at age 55  of a massive MI, low HDL last measuring at 57 and hypertriglyceridemia  at 5.  Her total cholesterol is 245.  LDL could not be calculated.   She denies any symptoms of angina or ischemia.  She has had no  tachypalpitations, presyncope, or syncope.   PAST MEDICAL HISTORY:  She had scoliosis. cholecystectomy, and  appendectomy in the past.   MEDICATIONS:  She is currently on vitamin D.   ALLERGIES/INTOLERANCES:  PROGESTERONE makes her angry and she is  intolerant of that.  She does not have any dye allergies.   SOCIAL HISTORY:  She does smoke a pack per day.  She says she knows she  needs to quit.  She does not use any recreational products.  She does  not drink alcohol.  She does like caffeine.  She says she is on her feet  10 hours a day at [employer] and is just too tired to walk  at night.   FAMILY HISTORY:  As above with her father having coronary artery disease  in his early 22s.   REVIEW OF SYSTEMS:  She has some constipation and gastroesophageal  reflux.  Otherwise  negative.  All systems were questioned.   PHYSICAL EXAMINATION:  VITAL SIGNS:  She is 5 feet 2-1/2.  Weighs 175  pounds.  Her blood pressure is 134/90, her pulse is 88 and regular.  EKG  is normal except for poor R-wave progression across anterior precordium.  HEENT:  She is normocephalic and atraumatic.  PERRLA.  Extraocular  movements are intact.  Sclerae are clear.  Facial symmetry is normal.  Dentition, need to repair.  NECK:  Supple.  Carotids upstrokes were equal bilaterally without  bruits, no JVD.  Thyroid is not enlarged.  Trachea is midline.  LUNGS:  Inspiratory and expiratory rhonchi.  HEART:  Poorly appreciated PMI.  Soft S1 and S2.  No murmur, rub, or  gallop.  ABDOMEN:  Obese, good bowel sounds.  There is no obvious midline or  pulsatile mass.  EXTREMITIES:  No  edema.  She has a delayed capillary reflux.  Her  dorsalis pedis is 1+/ 4+.  Posterior tibia was 1+/4+.  There is no sign  of DVT.  NEUROLOGIC:  Intact.  SKIN:  Unremarkable.   In retrospect, she also complains of tightness in her calves when she  walks uphill.   ASSESSMENT:  1. Severe mixed hyperlipidemia.  2. Tobacco use.  3. Sedentary lifestyle.  4. Obesity.  5. Probable peripheral claudication with palpable pulses, most likely      microvascular with her tobacco use.  6. Family history of coronary artery disease.   RECOMMENDATIONS:  I have spent 30 minutes talking and working with Ms.  Wiggins.  I have tried to motivate her to do the following:  1. Discontinue smoking altogether.  2. Aspirin 81 mg p.o. daily  3. Fenofibrate 145 mg p.o. daily  4. Low-carb diet, which I have reviewed at length.  5. Walking 3 hours per week.  6. Weight reduction, if possible.  7. Follow up labs including fasting lipids, comprehensive metabolic      panel, hemoglobin A1c in 6 weeks.  8. See me back in the office in about 2 months to review all this and      get followup.     Deborah C. Daleen Squibb, MD, [facility]  Electronically  Signed    TCW/MedQ  DD: 10/14/2008  DT: 10/15/2008  Job #: [id number]   cc:   Dineen Kid. Rana Snare, M.D.

## 2011-04-04 NOTE — Assessment & Plan Note (Signed)
[facility]                            CARDIOLOGY OFFICE NOTE   LAURALIE, BLACKSHER                         MRN:          [id number]  DATE:01/20/2009                            DOB:          02/05/1961    Ms. Goyne comes in today for followup.   PROBLEM LIST:  1. Mixed hyperlipidemia.  2. Tobacco use.  3. Sedentary lifestyle.  4. Obesity.  5. 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  6. Family history of coronary disease.   Ms. Farrington rarely admits that she just is not motivated.  She is  depressed about the state of our country and where we are headed.  She  says she does not sleep well, watches a lot of news.   She seems to know a lot about diet, i.e., specifically low carb diets.  She seems to be taking her fenofibrate 145 mg nightly.  However, total  cholesterol is 248, her triglycerides have only dropped from 1362 to  1038.  Her HDL is 24 and LDL was not calculated.  Her A1c is 7.2%.  Her  LFTs are normal.   PHYSICAL EXAMINATION:  Her blood pressure today is 162/101, her pulse is  92 and regular, her weight is 174 up 3.  Rest of her exam is unchanged.   I had about a 15-minute discussion with Ms. Gulley about serious healthy  therapeutic lifestyle changes.  These are outlined in my previous note.  She needs to lose at least 25-30 pounds, really cutout simple and  complex carbohydrates, continue regular medications and try to cut back  on smoking.  I have also advised 3 hours of walking per week.   I will plan on seeing her back again in 3 months.     Thomas C. Daleen Squibb, MD, [facility]  Electronically Signed    TCW/MedQ  DD: 01/20/2009  DT: 01/21/2009  Job #: [id number]

## 2011-05-04 ENCOUNTER — Other Ambulatory Visit: Payer: Self-pay | Admitting: *Deleted

## 2011-05-04 MED ORDER — LISINOPRIL 10 MG PO TABS: 10.0000 mg | ORAL_TABLET | Freq: Every day | ORAL | Status: DC

## 2011-05-05 ENCOUNTER — Other Ambulatory Visit: Payer: Self-pay | Admitting: *Deleted

## 2011-05-05 MED ORDER — LISINOPRIL 10 MG PO TABS: 10.0000 mg | ORAL_TABLET | Freq: Every day | ORAL | Status: DC

## 2011-05-05 NOTE — Telephone Encounter (Signed)
Medication Lisinopril 10mg  was not sent to [facility]. Resending to [facility]..Marland Kitchen6/15/12@8 :33am/LMB

## 2011-07-20 ENCOUNTER — Other Ambulatory Visit: Payer: Self-pay | Admitting: *Deleted

## 2011-07-20 MED ORDER — OMEPRAZOLE 20 MG PO CPDR: 20.0000 mg | DELAYED_RELEASE_CAPSULE | Freq: Every day | ORAL | Status: DC

## 2011-08-28 ENCOUNTER — Telehealth: Payer: Self-pay | Admitting: Internal Medicine

## 2011-08-28 NOTE — Telephone Encounter (Signed)
Forwarded to Dr. Leschber for review.  °

## 2011-08-30 ENCOUNTER — Telehealth: Payer: Self-pay | Admitting: Cardiology

## 2011-08-30 DIAGNOSIS — E7800 Pure hypercholesterolemia, unspecified: Secondary | ICD-10-CM

## 2011-08-30 NOTE — Telephone Encounter (Signed)
Returned call to patient -- She is wanting to come in  for labs prior to her office visit

## 2011-08-30 NOTE — Telephone Encounter (Signed)
Spoke with patient Her cell number has changed  New number is [phone]  Her appt with Dr Daleen Squibb is 09/29/11 at 4:00  She wants to come in the on Mon 09/25/11 at 8:30am  For fasting labs  I will place the order

## 2011-08-30 NOTE — Telephone Encounter (Signed)
PT CALLING TO SET UP LABWORK

## 2011-09-15 ENCOUNTER — Other Ambulatory Visit: Payer: Self-pay | Admitting: *Deleted

## 2011-09-15 ENCOUNTER — Other Ambulatory Visit (INDEPENDENT_AMBULATORY_CARE_PROVIDER_SITE_OTHER): Payer: 59

## 2011-09-15 ENCOUNTER — Ambulatory Visit (INDEPENDENT_AMBULATORY_CARE_PROVIDER_SITE_OTHER): Payer: 59 | Admitting: Internal Medicine

## 2011-09-15 ENCOUNTER — Other Ambulatory Visit: Payer: Self-pay | Admitting: Internal Medicine

## 2011-09-15 ENCOUNTER — Encounter: Payer: Self-pay | Admitting: Internal Medicine

## 2011-09-15 VITALS — BP 110/82 | HR 67 | Temp 98.1°F | Ht 65.0 in | Wt 170.0 lb

## 2011-09-15 DIAGNOSIS — E785 Hyperlipidemia, unspecified: Secondary | ICD-10-CM

## 2011-09-15 DIAGNOSIS — E119 Type 2 diabetes mellitus without complications: Secondary | ICD-10-CM

## 2011-09-15 LAB — LIPID PANEL: Triglycerides: 467 mg/dL — ABNORMAL HIGH (ref 0.0–149.0)

## 2011-09-15 LAB — CREATININE, SERUM: Creatinine, Ser: 0.7 mg/dL (ref 0.4–1.2)

## 2011-09-15 LAB — LDL CHOLESTEROL, DIRECT: Direct LDL: 65.3 mg/dL

## 2011-09-15 LAB — HEMOGLOBIN A1C: Hgb A1c MFr Bld: 9.3 % — ABNORMAL HIGH (ref 4.6–6.5)

## 2011-09-15 MED ORDER — LISINOPRIL 10 MG PO TABS: 10.0000 mg | ORAL_TABLET | Freq: Every day | ORAL | Status: DC

## 2011-09-15 MED ORDER — FENOFIBRATE 160 MG PO TABS: 160.0000 mg | ORAL_TABLET | Freq: Every day | ORAL | Status: DC

## 2011-09-15 NOTE — Assessment & Plan Note (Signed)
Variable compliance with fenofibrate and omega 3 -  Refuses statin - noncompliance  Check lipids now Lipid Panel     Component Value Date/Time   CHOL 203* 09/23/2010 0845   TRIG 1106.0 Lipemic mg/dL* 16/11/958 4540   HDL 98.11* 09/23/2010 0845   CHOLHDL 7 09/23/2010 0845   VLDL 221.2* 09/23/2010 0845

## 2011-09-15 NOTE — Patient Instructions (Signed)
It was good to see you today Test(s) ordered today. Your results will be called to you after review (48-72hours after test completion). If any changes need to be made, you will be notified at that time. If diabetes shows poor control, you will be contacted regarding alternate medications Please schedule followup in 4-6 months for diabetes mellitus and cholesterol check, call sooner if problems.

## 2011-09-15 NOTE — Progress Notes (Signed)
  Subjective:    Patient ID: Deborah Wiggins, female    DOB: 08-18-1961, 50 y.o.   MRN: [id number]  HPI  here for follow up - reviewed chronic medical issues   DM2 - reports noncompliance with metformin - changed self to herbal therapy. denies adverse side effects related to current therapy. tries to follow carb diet - no sig weight loss yet -   hyperlipidemia, esp hypertriglcerides - reports compliance with ongoing medical treatment and no changes in medication dose or frequency. denies adverse side effects related to current therapy.  follows with cards   hx scoli - s/p harrington rod surg 1977 - no daily back pain - does not follow with any local ortho for same   Past Medical History  Diagnosis Date  . Diabetes mellitus     Type II  . Hyperlipidemia     Mixed  . Obesity   . Scoliosis 1977    remote harrington rod surgery   Review of Systems  Constitutional: Negative for fatigue and unexpected weight change.  Respiratory: Negative for shortness of breath and wheezing.   Cardiovascular: Negative for chest pain and leg swelling.       Objective:   Physical Exam  BP 110/82  Pulse 67  Temp(Src) 98.1 F (36.7 C) (Oral)  Ht 5\' 5"  (1.651 m)  Wt 170 lb (77.111 kg)  BMI 28.29 kg/m2  SpO2 97% Wt Readings from Last 3 Encounters:  09/15/11 170 lb (77.111 kg)  10/03/10 175 lb 12.8 oz (79.742 kg)  08/05/10 175 lb (79.379 kg)   Constitutional: She appears well-developed and well-nourished. No distress. Neck: Normal range of motion. Neck supple. No JVD present. No thyromegaly present.  Cardiovascular: Normal rate, regular rhythm and normal heart sounds.  No murmur heard. No BLE edema. Pulmonary/Chest: Effort normal and breath sounds normal. No respiratory distress. She has no wheezes.  Psychiatric: She has a normal mood and affect. Her behavior is normal. Judgment and thought content normal.   Lab Results  Component Value Date   GLUCOSE 234* 03/18/2010   CHOL 203* 09/23/2010   TRIG 1106.0 Lipemic mg/dL* 40/07/8118   HDL 14.78* 09/23/2010   LDLDIRECT 48.7 09/23/2010   ALT 43* 09/23/2010   AST 48* 09/23/2010   NA 136 03/18/2010   K 3.7 03/18/2010   CL 103 03/18/2010   CREATININE 0.7 03/18/2010   BUN 8 03/18/2010   CO2 24 03/18/2010   HGBA1C 7.4* 08/05/2010   MICROALBUR 32.9* 04/15/2010      Assessment & Plan:  See problem list. Medications and labs reviewed today.

## 2011-09-15 NOTE — Assessment & Plan Note (Addendum)
Previously on metformin - stopped same 06/2011 when switch to berberine natural supplement Check a1c now and adjust tx prn Intermittent ACEI use - refill today Lab Results  Component Value Date   HGBA1C 7.4* 08/05/2010

## 2011-09-18 ENCOUNTER — Other Ambulatory Visit: Payer: Self-pay | Admitting: Internal Medicine

## 2011-09-18 DIAGNOSIS — E119 Type 2 diabetes mellitus without complications: Secondary | ICD-10-CM

## 2011-09-18 MED ORDER — SITAGLIPTIN PHOSPHATE 100 MG PO TABS: 100.0000 mg | ORAL_TABLET | Freq: Every day | ORAL | Status: DC

## 2011-09-25 ENCOUNTER — Other Ambulatory Visit: Payer: Self-pay | Admitting: Cardiology

## 2011-09-25 ENCOUNTER — Other Ambulatory Visit (INDEPENDENT_AMBULATORY_CARE_PROVIDER_SITE_OTHER): Payer: 59 | Admitting: *Deleted

## 2011-09-25 DIAGNOSIS — E7800 Pure hypercholesterolemia, unspecified: Secondary | ICD-10-CM

## 2011-09-25 LAB — HEPATIC FUNCTION PANEL
ALT: 48 U/L — ABNORMAL HIGH (ref 0–35)
Total Bilirubin: 0.9 mg/dL (ref 0.3–1.2)
Total Protein: 7.2 g/dL (ref 6.0–8.3)

## 2011-09-25 LAB — LDL CHOLESTEROL, DIRECT: Direct LDL: 55.3 mg/dL

## 2011-09-25 LAB — LIPID PANEL
Cholesterol: 129 mg/dL (ref 0–200)
Total CHOL/HDL Ratio: 4
Triglycerides: 397 mg/dL — ABNORMAL HIGH (ref 0.0–149.0)

## 2011-09-29 ENCOUNTER — Ambulatory Visit: Payer: Self-pay | Admitting: Cardiology

## 2011-11-02 ENCOUNTER — Encounter: Payer: Self-pay | Admitting: *Deleted

## 2011-11-02 ENCOUNTER — Ambulatory Visit (INDEPENDENT_AMBULATORY_CARE_PROVIDER_SITE_OTHER): Payer: 59 | Admitting: Cardiology

## 2011-11-02 ENCOUNTER — Encounter: Payer: Self-pay | Admitting: Cardiology

## 2011-11-02 VITALS — BP 132/80 | HR 75 | Ht 62.0 in | Wt 172.0 lb

## 2011-11-02 DIAGNOSIS — E785 Hyperlipidemia, unspecified: Secondary | ICD-10-CM

## 2011-11-02 NOTE — Assessment & Plan Note (Signed)
Her numbers look remarkably better. I reviewed them with her today. I've encouraged her to continue with her current medical program, exercise, and weight loss.

## 2011-11-02 NOTE — Progress Notes (Signed)
HPI Mrs. Deborah Wiggins comes in today for followup of her severe mixed hyperlipidemia.  She's lost weight, as being much more careful with her diet, is taking her meds. She no longer smokes.  Her last lipid panel in November showed a triglyceride count of 397 down from 1106 last year, total cholesterol 129 down from 203 last year, LDL 55.3, HDL unchanged at 29.9. Her LFTs were mildly elevated probably from fatty liver. These have been stable. There's nothing chest pain or angina.  Past Medical History  Diagnosis Date  . Diabetes mellitus     Type II  . Hyperlipidemia     Mixed  . Obesity   . Scoliosis 1977    remote harrington rod surgery    Current Outpatient Prescriptions  Medication Sig Dispense Refill  . Astaxanthin 4 MG CAPS Take by mouth daily.        . Berberine Chloride POWD Take 1,000 mg by mouth 2 (two) times daily.    0  . fenofibrate 160 MG tablet Take 1 tablet (160 mg total) by mouth daily.  30 tablet  6  . glucose blood (ONE TOUCH ULTRA TEST) test strip 1 each by Other route 2 (two) times daily. Use as instructed       . Gymnema Sylvestris Leaf POWD by Does not apply route daily.        Marland Kitchen lisinopril (PRINIVIL,ZESTRIL) 10 MG tablet Take 1 tablet (10 mg total) by mouth daily.  30 tablet  11  . metFORMIN (GLUCOPHAGE) 500 MG tablet Take 500 mg by mouth 2 (two) times daily with a meal.        . multivitamin (THERAGRAN) per tablet Take 1 tablet by mouth daily.        . NON FORMULARY Konjac Root 3 times daily        . NON FORMULARY Sea Buckthorn Oil daily        . Omega-3 Fatty Acids (RA FISH OIL) 1000 MG CAPS Take 1 capsule by mouth 2 (two) times daily.        Marland Kitchen omeprazole (PRILOSEC) 20 MG capsule Take 1 capsule (20 mg total) by mouth daily.  90 capsule  1  . ONE TOUCH LANCETS MISC 1 each by Does not apply route 2 (two) times daily.        . sitaGLIPtin (JANUVIA) 100 MG tablet Take 1 tablet (100 mg total) by mouth daily.  30 tablet  6    Allergies  Allergen Reactions  .  Prednisone     REACTION: angry    Family History  Problem Relation Age of Onset  . Dementia Mother     mild-mod  . Heart disease Father     CAD    History   Social History  . Marital Status: Married    Spouse Name: N/A    Number of Children: N/A  . Years of Education: N/A   Occupational History  . Not on file.   Social History Main Topics  . Smoking status: Former Smoker    Quit date: 06/20/2009  . Smokeless tobacco: Not on file   Comment: Regular Exercise - No  . Alcohol Use: No  . Drug Use: No  . Sexually Active:    Other Topics Concern  . Not on file   Social History Narrative   Pt is married and lives with spouse and dogs    ROS ALL NEGATIVE EXCEPT THOSE NOTED IN HPI  PE  General Appearance: well developed, well nourished in  no acute distress, obese HEENT: symmetrical face, PERRLA, good dentition  Neck: no JVD, thyromegaly, or adenopathy, trachea midline Chest: symmetric without deformity Cardiac: PMI non-displaced, RRR, normal S1, S2, no gallop or murmur Lung: clear to ausculation and percussion Vascular: all pulses full without bruits  Abdominal: nondistended, nontender, good bowel sounds, no HSM, no bruits Extremities: no cyanosis, clubbing or edema, no sign of DVT, no varicosities  Skin: normal color, no rashes Neuro: alert and oriented x 3, non-focal Pysch: normal affect  EKG Normal sinus rhythm, poor progression, no acute changes BMET    Component Value Date/Time   NA 136 03/18/2010 0835   K 3.7 03/18/2010 0835   CL 103 03/18/2010 0835   CO2 24 03/18/2010 0835   GLUCOSE 234* 03/18/2010 0835   BUN 8 03/18/2010 0835   CREATININE 0.7 09/15/2011 1149   CALCIUM 9.1 03/18/2010 0835   GFRNONAA 93.86 03/18/2010 0835    Lipid Panel     Component Value Date/Time   CHOL 129 09/25/2011 0911   TRIG 397.0* 09/25/2011 0911   HDL 29.90* 09/25/2011 0911   CHOLHDL 4 09/25/2011 0911   VLDL 79.4* 09/25/2011 0911    CBC No results found for this basename:  wbc, rbc, hgb, hct, plt, mcv, mch, mchc, rdw, neutrabs, lymphsabs, monoabs, eosabs, basosabs

## 2011-11-02 NOTE — Patient Instructions (Signed)
Your physician wants you to follow-up in: 1 year with Dr. Wall. You will receive a reminder letter in the mail two months in advance. If you don't receive a letter, please call our office to schedule the follow-up appointment.  Your physician recommends that you continue on your current medications as directed. Please refer to the Current Medication list given to you today.  

## 2012-07-29 ENCOUNTER — Other Ambulatory Visit (INDEPENDENT_AMBULATORY_CARE_PROVIDER_SITE_OTHER): Payer: 59

## 2012-07-29 ENCOUNTER — Encounter: Payer: Self-pay | Admitting: *Deleted

## 2012-07-29 ENCOUNTER — Encounter: Payer: Self-pay | Admitting: Internal Medicine

## 2012-07-29 ENCOUNTER — Ambulatory Visit (INDEPENDENT_AMBULATORY_CARE_PROVIDER_SITE_OTHER): Payer: 59 | Admitting: Internal Medicine

## 2012-07-29 VITALS — BP 132/80 | HR 84 | Temp 97.9°F | Ht 65.0 in | Wt 170.0 lb

## 2012-07-29 DIAGNOSIS — Z0000 Encounter for general adult medical examination without abnormal findings: Secondary | ICD-10-CM

## 2012-07-29 DIAGNOSIS — Z23 Encounter for immunization: Secondary | ICD-10-CM

## 2012-07-29 DIAGNOSIS — E119 Type 2 diabetes mellitus without complications: Secondary | ICD-10-CM

## 2012-07-29 DIAGNOSIS — Z79899 Other long term (current) drug therapy: Secondary | ICD-10-CM

## 2012-07-29 DIAGNOSIS — E785 Hyperlipidemia, unspecified: Secondary | ICD-10-CM

## 2012-07-29 LAB — BASIC METABOLIC PANEL
Calcium: 9.3 mg/dL (ref 8.4–10.5)
GFR: 87.22 mL/min (ref >60.00)
Potassium: 4.1 mEq/L (ref 3.5–5.1)
Sodium: 138 mEq/L (ref 135–145)

## 2012-07-29 LAB — URINALYSIS, ROUTINE W REFLEX MICROSCOPIC
Bilirubin Urine: NEGATIVE
Ketones, ur: NEGATIVE
Leukocytes, UA: NEGATIVE
Specific Gravity, Urine: 1.01 (ref 1.000–1.030)
Urine Glucose: NEGATIVE
pH: 7.5 (ref 5.0–8.0)

## 2012-07-29 LAB — LIPID PANEL
Cholesterol: 199 mg/dL (ref 0–200)
HDL: 32.8 mg/dL — ABNORMAL LOW (ref >39.00)
Total CHOL/HDL Ratio: 6
Triglycerides: 773 mg/dL — ABNORMAL HIGH (ref 0.0–149.0)

## 2012-07-29 LAB — CBC WITH DIFFERENTIAL/PLATELET
Basophils Relative: 0.6 % (ref 0.0–3.0)
Eosinophils Relative: 1.6 % (ref 0.0–5.0)
HCT: 42 % (ref 36.0–46.0)
Hemoglobin: 14 g/dL (ref 12.0–15.0)
Lymphocytes Relative: 37.3 % (ref 12.0–46.0)
Lymphs Abs: 2.7 10*3/uL (ref 0.7–4.0)
Monocytes Relative: 6.8 % (ref 3.0–12.0)
Neutro Abs: 3.9 10*3/uL (ref 1.4–7.7)
RBC: 4.71 Mil/uL (ref 3.87–5.11)

## 2012-07-29 LAB — HEPATIC FUNCTION PANEL
ALT: 33 U/L (ref 0–35)
AST: 34 U/L (ref 0–37)
Albumin: 4.3 g/dL (ref 3.5–5.2)
Total Bilirubin: 0.5 mg/dL (ref 0.3–1.2)

## 2012-07-29 LAB — HEMOGLOBIN A1C: Hgb A1c MFr Bld: 7.4 % — ABNORMAL HIGH (ref 4.6–6.5)

## 2012-07-29 MED ORDER — LISINOPRIL 10 MG PO TABS: 10.0000 mg | ORAL_TABLET | Freq: Every day | ORAL | Status: DC

## 2012-07-29 NOTE — Assessment & Plan Note (Addendum)
Previously on metformin - stopped same 06/2011 when switched to "berberine" natural supplement Declines to resume same and refused Januvia as rx'd 10/12 Check a1c now and try to adjust tx prn Intermittent ACEI use - encouraged to resume same today Lab Results  Component Value Date   HGBA1C 9.3* 09/15/2011

## 2012-07-29 NOTE — Progress Notes (Signed)
Subjective:    Patient ID: Deborah Wiggins, female    DOB: [birth year], 51 y.o.   MRN: [id number]  HPI  patient is here today for annual physical. Patient feels well overall.  Also reviewed chronic medical issues   DM2 - reports noncompliance with metformin since 2008 - declined to resume it or Januvia as rx'd 10/12 - changed self to herbal therapy. denies adverse side effects related to current therapy. tries to follow carb diet - no significant weight loss yet -   hyperlipidemia, esp hypertriglcerides - reports compliance with ongoing medical treatment and no changes in medication dose or frequency. denies adverse side effects related to current therapy.  follows with cards   hx scoli - s/p harrington rod surg 1977 - no daily back pain - does not follow with any local ortho for same   Past Medical History  Diagnosis Date  . Diabetes mellitus     Type II  . Hyperlipidemia     Mixed  . Obesity   . Scoliosis 1977    remote harrington rod surgery   Family History  Problem Relation Age of Onset  . Dementia Mother     mild-mod  . Heart disease Father     CAD   History  Substance Use Topics  . Smoking status: Former Smoker    Quit date: 06/20/2009  . Smokeless tobacco: Not on file   Comment: Regular Exercise - No  . Alcohol Use: No    Review of Systems  Constitutional: Negative for fatigue and unexpected weight change.  Respiratory: Negative for shortness of breath and wheezing.   Cardiovascular: Negative for chest pain and leg swelling.  Gastrointestinal: Negative for abdominal pain, no bowel changes.  Musculoskeletal: Negative for gait problem or joint swelling.  Skin: Negative for rash.  Neurological: Negative for dizziness or headache.  No other specific complaints in a complete review of systems (except as listed in HPI above).      Objective:   Physical Exam  BP 132/80  Pulse 84  Temp 97.9 F (36.6 C) (Oral)  Ht 5\' 5"  (1.651 m)  Wt 170 lb (77.111 kg)  BMI  28.29 kg/m2  SpO2 96% Wt Readings from Last 3 Encounters:  07/29/12 170 lb (77.111 kg)  11/02/11 172 lb (78.019 kg)  09/15/11 170 lb (77.111 kg)   Constitutional: She appears well-developed and well-nourished. No distress.  HENT: Head: Normocephalic and atraumatic. Ears: B TMs ok, no erythema or effusion; Nose: Nose normal. Mouth/Throat: Oropharynx is clear and moist. No oropharyngeal exudate.  Eyes: Conjunctivae and EOM are normal. Pupils are equal, round, and reactive to light. No scleral icterus.  Neck: Normal range of motion. Neck supple. No JVD present. No thyromegaly present.  Cardiovascular: Normal rate, regular rhythm and normal heart sounds.  No murmur heard. No BLE edema. Pulmonary/Chest: Effort normal and breath sounds normal. No respiratory distress. She has no wheezes.  Abdominal: Soft. Bowel sounds are normal. She exhibits no distension. There is no tenderness. no masses Musculoskeletal: Normal range of motion, no joint effusions. No gross deformities Neurological: She is alert and oriented to person, place, and time. No cranial nerve deficit. Coordination normal.  Skin: Skin is warm and dry. No rash noted. No erythema.  Psychiatric: She has a normal mood and affect. Her behavior is normal. Judgment and thought content normal.    Lab Results  Component Value Date   GLUCOSE 234* 03/18/2010   CHOL 129 09/25/2011   TRIG 397.0* 09/25/2011   HDL  29.90* 09/25/2011   LDLDIRECT 55.3 09/25/2011   ALT 48* 09/25/2011   AST 47* 09/25/2011   NA 136 03/18/2010   K 3.7 03/18/2010   CL 103 03/18/2010   CREATININE 0.7 09/15/2011   BUN 8 03/18/2010   CO2 24 03/18/2010   HGBA1C 9.3* 09/15/2011   MICROALBUR 32.9* 04/15/2010   ECG: no change from 10/24/11 - anterior Qs, no acute changes    Assessment & Plan:  CPX/v70.0 - Patient has been counseled on age-appropriate routine health concerns for screening and prevention. These are reviewed and up-to-date. Immunizations are up-to-date or declined.  Labs and ECG reviewed.  Also see problem list. Medications and labs reviewed today.

## 2012-07-29 NOTE — Assessment & Plan Note (Signed)
Declines rx meds The patient is asked to make an attempt to improve diet and exercise patterns to aid in medical management of this problem.

## 2012-07-29 NOTE — Patient Instructions (Addendum)
It was good to see you today Health Maintenance reviewed - all recommended immunizations and age-appropriate screenings are up-to-date.  Test(s) ordered today. Your results will be called to you after review (48-72hours after test completion). If any changes need to be made, you will be notified at that time. If diabetes shows poor control, you will be contacted regarding alternate medications like Januvia Medications reviewed, no changes at this time. Take all medications as prescribed Please schedule followup in 4-6 months for diabetes mellitus and cholesterol check, call sooner if problems.  Health Maintenance, Females A healthy lifestyle and preventative care can promote health and wellness.  Maintain regular health, dental, and eye exams.   Eat a healthy diet. Foods like vegetables, fruits, whole grains, low-fat dairy products, and lean protein foods contain the nutrients you need without too many calories. Decrease your intake of foods high in solid fats, added sugars, and salt. Get information about a proper diet from your caregiver, if necessary.   Regular physical exercise is one of the most important things you can do for your health. Most adults should get at least 150 minutes of moderate-intensity exercise (any activity that increases your heart rate and causes you to sweat) each week. In addition, most adults need muscle-strengthening exercises on 2 or more days a week.     Maintain a healthy weight. The body mass index (BMI) is a screening tool to identify possible weight problems. It provides an estimate of body fat based on height and weight. Your caregiver can help determine your BMI, and can help you achieve or maintain a healthy weight. For adults 20 years and older:   A BMI below 18.5 is considered underweight.   A BMI of 18.5 to 24.9 is normal.   A BMI of 25 to 29.9 is considered overweight.   A BMI of 30 and above is considered obese.   Maintain normal blood lipids and  cholesterol by exercising and minimizing your intake of saturated fat. Eat a balanced diet with plenty of fruits and vegetables. Blood tests for lipids and cholesterol should begin at age 32 and be repeated every 5 years. If your lipid or cholesterol levels are high, you are over 50, or you are a high risk for heart disease, you may need your cholesterol levels checked more frequently. Ongoing high lipid and cholesterol levels should be treated with medicines if diet and exercise are not effective.   If you smoke, find out from your caregiver how to quit. If you do not use tobacco, do not start.   If you are pregnant, do not drink alcohol. If you are breastfeeding, be very cautious about drinking alcohol. If you are not pregnant and choose to drink alcohol, do not exceed 1 drink per day. One drink is considered to be 12 ounces (355 mL) of beer, 5 ounces (148 mL) of wine, or 1.5 ounces (44 mL) of liquor.   Avoid use of street drugs. Do not share needles with anyone. Ask for help if you need support or instructions about stopping the use of drugs.   High blood pressure causes heart disease and increases the risk of stroke. Blood pressure should be checked at least every 1 to 2 years. Ongoing high blood pressure should be treated with medicines, if weight loss and exercise are not effective.   If you are 69 to 53 years old, ask your caregiver if you should take aspirin to prevent strokes.   Diabetes screening involves taking a blood sample  to check your fasting blood sugar level. This should be done once every 3 years, after age 43, if you are within normal weight and without risk factors for diabetes. Testing should be considered at a younger age or be carried out more frequently if you are overweight and have at least 1 risk factor for diabetes.   Breast cancer screening is essential preventative care for women. You should practice "breast self-awareness." This means understanding the normal appearance  and feel of your breasts and may include breast self-examination. Any changes detected, no matter how small, should be reported to a caregiver. Women in their 67s and 30s should have a clinical breast exam (CBE) by a caregiver as part of a regular health exam every 1 to 3 years. After age 63, women should have a CBE every year. Starting at age 25, women should consider having a mammogram (breast X-ray) every year. Women who have a family history of breast cancer should talk to their caregiver about genetic screening. Women at a high risk of breast cancer should talk to their caregiver about having an MRI and a mammogram every year.   The Pap test is a screening test for cervical cancer. Women should have a Pap test starting at age 52. Between ages 52 and 23, Pap tests should be repeated every 2 years. Beginning at age 65, you should have a Pap test every 3 years as Schirmer as the past 3 Pap tests have been normal. If you had a hysterectomy for a problem that was not cancer or a condition that could lead to cancer, then you no longer need Pap tests. If you are between ages 28 and 35, and you have had normal Pap tests going back 10 years, you no longer need Pap tests. If you have had past treatment for cervical cancer or a condition that could lead to cancer, you need Pap tests and screening for cancer for at least 20 years after your treatment. If Pap tests have been discontinued, risk factors (such as a new sexual partner) need to be reassessed to determine if screening should be resumed. Some women have medical problems that increase the chance of getting cervical cancer. In these cases, your caregiver may recommend more frequent screening and Pap tests.   The human papillomavirus (HPV) test is an additional test that may be used for cervical cancer screening. The HPV test looks for the virus that can cause the cell changes on the cervix. The cells collected during the Pap test can be tested for HPV. The HPV test  could be used to screen women aged 58 years and older, and should be used in women of any age who have unclear Pap test results. After the age of 62, women should have HPV testing at the same frequency as a Pap test.   Colorectal cancer can be detected and often prevented. Most routine colorectal cancer screening begins at the age of 26 and continues through age 74. However, your caregiver may recommend screening at an earlier age if you have risk factors for colon cancer. On a yearly basis, your caregiver may provide home test kits to check for hidden blood in the stool. Use of a small camera at the end of a tube, to directly examine the colon (sigmoidoscopy or colonoscopy), can detect the earliest forms of colorectal cancer. Talk to your caregiver about this at age 75, when routine screening begins. Direct examination of the colon should be repeated every 5 to 10 years  through age 73, unless early forms of pre-cancerous polyps or small growths are found.   Hepatitis C blood testing is recommended for all people born from [birth year] through 1965 and any individual with known risks for hepatitis C.   Practice safe sex. Use condoms and avoid high-risk sexual practices to reduce the spread of sexually transmitted infections (STIs). Sexually active women aged 56 and younger should be checked for Chlamydia, which is a common sexually transmitted infection. Older women with new or multiple partners should also be tested for Chlamydia. Testing for other STIs is recommended if you are sexually active and at increased risk.   Osteoporosis is a disease in which the bones lose minerals and strength with aging. This can result in serious bone fractures. The risk of osteoporosis can be identified using a bone density scan. Women ages 27 and over and women at risk for fractures or osteoporosis should discuss screening with their caregivers. Ask your caregiver whether you should be taking a calcium supplement or vitamin D to  reduce the rate of osteoporosis.   Menopause can be associated with physical symptoms and risks. Hormone replacement therapy is available to decrease symptoms and risks. You should talk to your caregiver about whether hormone replacement therapy is right for you.   Use sunscreen with a sun protection factor (SPF) of 30 or greater. Apply sunscreen liberally and repeatedly throughout the day. You should seek shade when your shadow is shorter than you. Protect yourself by wearing long sleeves, pants, a wide-brimmed hat, and sunglasses year round, whenever you are outdoors.   Notify your caregiver of new moles or changes in moles, especially if there is a change in shape or color. Also notify your caregiver if a mole is larger than the size of a pencil eraser.   Stay current with your immunizations.  Document Released: 05/22/2011 Document Revised: 10/26/2011 Document Reviewed: 05/22/2011 [facility] Patient Information 2012 [place], [place].

## 2012-10-24 ENCOUNTER — Ambulatory Visit (INDEPENDENT_AMBULATORY_CARE_PROVIDER_SITE_OTHER): Payer: 59 | Admitting: *Deleted

## 2012-10-24 DIAGNOSIS — E785 Hyperlipidemia, unspecified: Secondary | ICD-10-CM

## 2012-10-24 LAB — LIPID PANEL
Cholesterol: 182 mg/dL (ref 0–200)
Total CHOL/HDL Ratio: 6
VLDL: 130.6 mg/dL — ABNORMAL HIGH (ref 0.0–40.0)

## 2012-10-24 LAB — HEPATIC FUNCTION PANEL
ALT: 29 U/L (ref 0–35)
AST: 35 U/L (ref 0–37)
Alkaline Phosphatase: 86 U/L (ref 39–117)
Total Bilirubin: 0.5 mg/dL (ref 0.3–1.2)

## 2012-10-31 ENCOUNTER — Encounter: Payer: Self-pay | Admitting: Cardiology

## 2012-10-31 ENCOUNTER — Ambulatory Visit (INDEPENDENT_AMBULATORY_CARE_PROVIDER_SITE_OTHER): Payer: 59 | Admitting: Cardiology

## 2012-10-31 VITALS — BP 140/100 | HR 87 | Ht 65.0 in | Wt 175.0 lb

## 2012-10-31 DIAGNOSIS — E669 Obesity, unspecified: Secondary | ICD-10-CM

## 2012-10-31 DIAGNOSIS — Z9119 Patient's noncompliance with other medical treatment and regimen: Secondary | ICD-10-CM

## 2012-10-31 DIAGNOSIS — E119 Type 2 diabetes mellitus without complications: Secondary | ICD-10-CM

## 2012-10-31 DIAGNOSIS — E785 Hyperlipidemia, unspecified: Secondary | ICD-10-CM

## 2012-10-31 MED ORDER — LISINOPRIL 10 MG PO TABS: 10.0000 mg | ORAL_TABLET | Freq: Every day | ORAL | Status: DC

## 2012-10-31 NOTE — Progress Notes (Signed)
HPI Mrs Deborah Wiggins returns today for evaluation and management of her severe mixed hyperlipidemia. She also is diabetic with her last hemoglobin A1c being 7.4%. He has been as high as 9%. Her last lipid profile showed total cholesterol 182, HDL 31.9, VLDL 130.6, triglycerides 653. Her triglycerides again is high as 1106 in the past.  She has an excellent primary care physician and Dr. Bayard Hugger. Please see her note. She has refused to take metformin which she says does not work and cannot afford Januvia.   She takes several alternative medications which she says are scientifically proven to work. I've tried in the past to talk her out of this and she does not listen.  She's going through a separation. She's working very difficult hours. She says she is eating whatever she wants to eat now.  Past Medical History  Diagnosis Date  . Diabetes mellitus     Type II  . Hyperlipidemia     Mixed  . Obesity   . Scoliosis 1977    remote harrington rod surgery    Current Outpatient Prescriptions  Medication Sig Dispense Refill  . Ascorbic Acid (VITAMIN C PO) Take by mouth.      . B Complex Vitamins (VITAMIN B COMPLEX IJ) Inject as directed.      . B Complex-Biotin-FA (VITAMIN B50 COMPLEX PO) Take by mouth.      . Berberine Chloride POWD Take 1,000 mg by mouth 2 (two) times daily.    0  . cholecalciferol (VITAMIN D) 1000 UNITS tablet Take 1,000 Units by mouth daily.      . Coenzyme Q10 (CO Q-10) 200 MG CAPS Take by mouth.      Marland Kitchen glucose blood (ONE TOUCH ULTRA TEST) test strip 1 each by Other route 2 (two) times daily. Use as instructed       . Gymnema Sylvestris Leaf POWD by Does not apply route daily.        Marland Kitchen lisinopril (PRINIVIL,ZESTRIL) 10 MG tablet Take 1 tablet (10 mg total) by mouth daily.  30 tablet  11  . multivitamin (THERAGRAN) per tablet Take 1 tablet by mouth daily.        . niacin 500 MG tablet Take 1,500 mg by mouth 2 (two) times daily.      . NON FORMULARY Konjac Root 3 times daily    . NON FORMULARY Sea Buckthorn Oil daily        . NON FORMULARY Berbero-Gluco Defense over the counter vitamin take four times a day      . NON FORMULARY Cinnamon Solgar over counter vitamin takes daily      . Omega-3 Fatty Acids (RA FISH OIL) 1000 MG CAPS Take 1 capsule by mouth 2 (two) times daily.        Marland Kitchen omeprazole (PRILOSEC) 20 MG capsule Take 20 mg by mouth daily as needed.      . ONE TOUCH LANCETS MISC 1 each by Does not apply route 2 (two) times daily.        Marland Kitchen VITAMIN E PO Take by mouth.        Allergies  Allergen Reactions  . Prednisone     REACTION: angry    Family History  Problem Relation Age of Onset  . Dementia Mother     mild-mod  . Heart disease Father     CAD    History   Social History  . Marital Status: Married    Spouse Name: N/A    Number of  Children: N/A  . Years of Education: N/A   Occupational History  . Not on file.   Social History Main Topics  . Smoking status: Former Smoker    Quit date: 06/20/2009  . Smokeless tobacco: Not on file     Comment: Regular Exercise - No  . Alcohol Use: No  . Drug Use: No  . Sexually Active:    Other Topics Concern  . Not on file   Social History Narrative   Pt is married and lives with spouse and dogs    ROS ALL NEGATIVE EXCEPT THOSE NOTED IN HPI  PE  General Appearance: well developed, well nourished in no acute distress, obese HEENT: symmetrical face, PERRLA, good dentition  Neck: no JVD, thyromegaly, or adenopathy, trachea midline Chest: symmetric without deformity Cardiac: PMI non-displaced, RRR, normal S1, S2, no gallop or murmur Lung: clear to ausculation and percussion Vascular: all pulses full without bruits  Abdominal: nondistended, nontender, good bowel sounds, no HSM, no bruits Extremities: no cyanosis, clubbing or edema, no sign of DVT, no varicosities  Skin: normal color, no rashes Neuro: alert and oriented x 3, non-focal Pysch: normal affect  EKG  BMET    Component Value  Date/Time   NA 138 07/29/2012 1512   K 4.1 07/29/2012 1512   CL 101 07/29/2012 1512   CO2 26 07/29/2012 1512   GLUCOSE 132* 07/29/2012 1512   BUN 20 07/29/2012 1512   CREATININE 0.7 07/29/2012 1512   CALCIUM 9.3 07/29/2012 1512   GFRNONAA 93.86 03/18/2010 0835    Lipid Panel     Component Value Date/Time   CHOL 182 10/24/2012 0834   TRIG 653.0 Triglyceride is over 400; calculations on Lipids are invalid.* 10/24/2012 0834   HDL 31.90* 10/24/2012 0834   CHOLHDL 6 10/24/2012 0834   VLDL 130.6* 10/24/2012 0834    CBC    Component Value Date/Time   WBC 7.3 07/29/2012 1512   RBC 4.71 07/29/2012 1512   HGB 14.0 07/29/2012 1512   HCT 42.0 07/29/2012 1512   PLT 160.0 07/29/2012 1512   MCV 89.2 07/29/2012 1512   MCHC 33.4 07/29/2012 1512   RDW 13.1 07/29/2012 1512   LYMPHSABS 2.7 07/29/2012 1512   MONOABS 0.5 07/29/2012 1512   EOSABS 0.1 07/29/2012 1512   BASOSABS 0.0 07/29/2012 1512

## 2012-10-31 NOTE — Assessment & Plan Note (Signed)
As long as this is an issue, there is very little we can do to help her. We've had this discussion once again. I'll see her back when necessary. She promises to call Dr. Felicity Coyer for a generic diabetic drug. I've encouraged her to take fish oil  and not take any other alternative medicines.

## 2012-10-31 NOTE — Patient Instructions (Addendum)
Your physician recommends that you continue on your current medications as directed. Please refer to the Current Medication list given to you today.  Your physician wants you to follow-up in: 1 year with Dr. Daleen Squibb. You will receive a reminder letter in the mail two months in advance. If you don't receive a letter, please call our office to schedule the follow-up appointment.  Please give Dr. Felicity Coyer a call about a more affordable diabetes medication

## 2012-11-06 ENCOUNTER — Encounter: Payer: Self-pay | Admitting: Internal Medicine

## 2012-11-06 DIAGNOSIS — E785 Hyperlipidemia, unspecified: Secondary | ICD-10-CM

## 2012-11-06 MED ORDER — LIRAGLUTIDE 18 MG/3ML ~~LOC~~ SOLN: 6.0000 mg | Freq: Every day | SUBCUTANEOUS | Status: DC

## 2012-11-06 MED ORDER — LISINOPRIL 10 MG PO TABS: 10.0000 mg | ORAL_TABLET | Freq: Every day | ORAL | Status: DC

## 2012-11-07 ENCOUNTER — Encounter: Payer: Self-pay | Admitting: Internal Medicine

## 2012-11-07 ENCOUNTER — Telehealth: Payer: Self-pay | Admitting: *Deleted

## 2012-11-07 MED ORDER — LIRAGLUTIDE 18 MG/3ML ~~LOC~~ SOLN: 0.6000 mg | Freq: Every day | SUBCUTANEOUS | Status: DC

## 2012-11-07 NOTE — Telephone Encounter (Signed)
.  6ml dose, rx corrected  - thanks

## 2012-11-07 NOTE — Telephone Encounter (Signed)
Faxed form back with md response,,,/lmb

## 2012-11-07 NOTE — Telephone Encounter (Signed)
Received rx for Liraglutide per directions inject 1 ml into skin daily. Wanting md to clarify available in 0.6 mg, 1.2 mg and 1.8mg ...Raechel Chute

## 2012-11-18 ENCOUNTER — Encounter: Payer: Self-pay | Admitting: Internal Medicine

## 2012-11-18 MED ORDER — INSULIN PEN NEEDLE 29G X 12MM MISC: Status: DC

## 2013-01-04 ENCOUNTER — Other Ambulatory Visit: Payer: Self-pay

## 2013-01-06 ENCOUNTER — Ambulatory Visit (INDEPENDENT_AMBULATORY_CARE_PROVIDER_SITE_OTHER): Payer: 59 | Admitting: Internal Medicine

## 2013-01-06 ENCOUNTER — Other Ambulatory Visit (INDEPENDENT_AMBULATORY_CARE_PROVIDER_SITE_OTHER): Payer: 59

## 2013-01-06 ENCOUNTER — Encounter: Payer: Self-pay | Admitting: Internal Medicine

## 2013-01-06 VITALS — BP 118/72 | HR 66 | Temp 97.9°F | Wt 164.8 lb

## 2013-01-06 DIAGNOSIS — E119 Type 2 diabetes mellitus without complications: Secondary | ICD-10-CM

## 2013-01-06 DIAGNOSIS — J019 Acute sinusitis, unspecified: Secondary | ICD-10-CM

## 2013-01-06 LAB — HEMOGLOBIN A1C: Hgb A1c MFr Bld: 6.5 % (ref 4.6–6.5)

## 2013-01-06 MED ORDER — AZITHROMYCIN 250 MG PO TABS: ORAL_TABLET | ORAL | Status: DC

## 2013-01-06 NOTE — Patient Instructions (Signed)
It was good to see you today We have reviewed your prior records including labs and tests today Test(s) ordered today. Your results will be released to MyChart (or called to you) after review, usually within 72hours after test completion. If any changes need to be made, you will be notified at that same time. If diabetes shows poor control, you will be contacted regarding alternate dose of Victoza if needed Work on lifestyle changes as discussed (low fat, low carb, increased protein diet; improved exercise efforts; weight loss) to control sugar, blood pressure and cholesterol levels and/or reduce risk of developing other medical problems. Look into [email or web address] or other type of food journal to assist you in this process. Zpack for sinus infection symptoms -  Your prescription(s) have been submitted to your pharmacy. Please take as directed and contact our office if you believe you are having problem(s) with the medication(s). Please schedule followup in 4-6 months for diabetes mellitus and cholesterol check, call sooner if problems.

## 2013-01-06 NOTE — Assessment & Plan Note (Signed)
Previously on metformin - stopped same 06/2011 when switched to "berberine" natural supplement refused Januvia as rx'd 10/12 Started victoza 10/2012 - doing well with improved Check a1c now and try to adjust tx prn  Lab Results  Component Value Date   HGBA1C 7.4* 07/29/2012

## 2013-01-06 NOTE — Progress Notes (Signed)
  Subjective:    Patient ID: Deborah Wiggins, female    DOB: 03/30/1961, 52 y.o.   MRN: [id number]  HPI  Here for follow up - reviewed chronic medical issues   DM2 - on victoza since 10/2012 - the patient reports compliance with medication(s) as prescribed. Denies adverse side effects. previously noncompliance with metformin since 2008 - declined to resume it or Januvia as rx'd 10/12 - changed self to herbal therapy. denies adverse side effects related to current therapy. tries to follow carb diet -    hyperlipidemia, esp hypertriglcerides - reports compliance with ongoing medical treatment and no changes in medication dose or frequency. denies adverse side effects related to current therapy.  follows with cards   hx scoli - s/p harrington rod surg 1977 - no daily back pain - does not follow with any local ortho for same   complains of "head cold" x 3 weeks Yellow nasal discharge and congestion LGF No headache  Not responding to OTC meds  Past Medical History  Diagnosis Date  . Diabetes mellitus     Type II  . Hyperlipidemia     Mixed  . Obesity   . Scoliosis 1977    remote harrington rod surgery    Review of Systems  Constitutional: Negative for fatigue and unexpected weight change.  Respiratory: Negative for shortness of breath and wheezing.   Cardiovascular: Negative for chest pain and leg swelling.       Objective:   Physical Exam  BP 118/72  Pulse 66  Temp(Src) 97.9 F (36.6 C) (Oral)  Wt 164 lb 12.8 oz (74.753 kg)  BMI 27.42 kg/m2  SpO2 98% Wt Readings from Last 3 Encounters:  01/06/13 164 lb 12.8 oz (74.753 kg)  10/31/12 175 lb (79.379 kg)  07/29/12 170 lb (77.111 kg)   Constitutional: She appears well-developed and well-nourished. No distress.  HENT: Head: Normocephalic and atraumatic. Sinus mod tender over B max. Ears: B TMs ok, no erythema or effusion; Nose: Nose normal. Mouth/Throat: Oropharynx is clear and moist. No oropharyngeal exudate.  Eyes:  Conjunctivae and EOM are normal. Pupils are equal, round, and reactive to light. No scleral icterus.  Neck: Normal range of motion. Neck supple. No JVD present. No thyromegaly present.  Cardiovascular: Normal rate, regular rhythm and normal heart sounds.  No murmur heard. No BLE edema. Pulmonary/Chest: Effort normal and breath sounds normal. No respiratory distress. She has no wheezes.  Psychiatric: She has a normal mood and affect. Her behavior is normal. Judgment and thought content normal.    Lab Results  Component Value Date   WBC 7.3 07/29/2012   HGB 14.0 07/29/2012   HCT 42.0 07/29/2012   PLT 160.0 07/29/2012   GLUCOSE 132* 07/29/2012   CHOL 182 10/24/2012   TRIG 653.0 Triglyceride is over 400; calculations on Lipids are invalid.* 10/24/2012   HDL 31.90* 10/24/2012   LDLDIRECT 68.9 10/24/2012   ALT 29 10/24/2012   AST 35 10/24/2012   NA 138 07/29/2012   K 4.1 07/29/2012   CL 101 07/29/2012   CREATININE 0.7 07/29/2012   BUN 20 07/29/2012   CO2 26 07/29/2012   TSH 1.61 07/29/2012   HGBA1C 7.4* 07/29/2012   MICROALBUR 32.9* 04/15/2010       Assessment & Plan:   see problem list. Medications and labs reviewed today.  Acute sinusitis - symptoms present > 3 weeks Tender on exam -  antibiotics tx given duration symptoms and comorbid dz

## 2013-06-14 ENCOUNTER — Encounter: Payer: Self-pay | Admitting: Internal Medicine

## 2013-06-16 ENCOUNTER — Encounter: Payer: Self-pay | Admitting: Internal Medicine

## 2013-06-16 ENCOUNTER — Other Ambulatory Visit: Payer: Self-pay | Admitting: Internal Medicine

## 2013-06-16 ENCOUNTER — Other Ambulatory Visit: Payer: Self-pay | Admitting: *Deleted

## 2013-06-16 MED ORDER — VARENICLINE TARTRATE 1 MG PO TABS: 1.0000 mg | ORAL_TABLET | Freq: Two times a day (BID) | ORAL | Status: DC

## 2013-06-16 NOTE — Telephone Encounter (Signed)
Pt sent md email. Chantix was sent to wrong pharmacy. Resending to [facility] on [address]...lmb

## 2013-06-25 LAB — HM DIABETES EYE EXAM

## 2013-06-27 ENCOUNTER — Telehealth: Payer: Self-pay | Admitting: Internal Medicine

## 2013-06-27 NOTE — Telephone Encounter (Signed)
rec'd records from [facility], Forward 1 page to Dr. Felicity Coyer

## 2013-06-30 ENCOUNTER — Encounter: Payer: Self-pay | Admitting: Internal Medicine

## 2013-07-07 ENCOUNTER — Ambulatory Visit (INDEPENDENT_AMBULATORY_CARE_PROVIDER_SITE_OTHER): Payer: 59 | Admitting: Internal Medicine

## 2013-07-07 ENCOUNTER — Other Ambulatory Visit (INDEPENDENT_AMBULATORY_CARE_PROVIDER_SITE_OTHER): Payer: 59

## 2013-07-07 ENCOUNTER — Encounter: Payer: Self-pay | Admitting: Internal Medicine

## 2013-07-07 VITALS — BP 132/70 | HR 54 | Temp 98.1°F | Ht 62.5 in | Wt 146.2 lb

## 2013-07-07 DIAGNOSIS — I10 Essential (primary) hypertension: Secondary | ICD-10-CM | POA: Insufficient documentation

## 2013-07-07 DIAGNOSIS — E785 Hyperlipidemia, unspecified: Secondary | ICD-10-CM

## 2013-07-07 DIAGNOSIS — E119 Type 2 diabetes mellitus without complications: Secondary | ICD-10-CM

## 2013-07-07 LAB — LIPID PANEL
HDL: 36.5 mg/dL — ABNORMAL LOW (ref >39.00)
Total CHOL/HDL Ratio: 5

## 2013-07-07 LAB — HEMOGLOBIN A1C: Hgb A1c MFr Bld: 5.3 % (ref 4.6–6.5)

## 2013-07-07 NOTE — Progress Notes (Signed)
  Subjective:    Patient ID: Deborah Wiggins, female    DOB: 04-24-1961, 52 y.o.   MRN: [id number]  HPI  Here for follow up - reviewed chronic medical issues   DM2 - on victoza since 10/2012 - the patient reports compliance with medication(s) as prescribed. previously noncompliant with metformin as initially rx'd in 2008; treats herself with herbal therapy. denies adverse side effects related to current therapy. tries to follow carb diet -    hyperlipidemia, esp hypertriglcerides - takes niacin, declines statin. reports compliance with ongoing medical treatment and no changes in medication dose or frequency. denies adverse side effects related to current therapy.  follows with cards   hx scoli - s/p harrington rod surg 1977 - no daily back pain - does not follow with any local ortho for same    Past Medical History  Diagnosis Date  . Diabetes mellitus     Type II  . Hyperlipidemia     Mixed  . Obesity   . Scoliosis 1977    remote harrington rod surgery    Review of Systems  Constitutional: Negative for fatigue and unexpected weight change.  Respiratory: Negative for shortness of breath and wheezing.   Cardiovascular: Negative for chest pain and leg swelling.       Objective:   Physical Exam  BP 132/70  Pulse 54  Temp(Src) 98.1 F (36.7 C) (Oral)  Ht 5' 2.5" (1.588 m)  Wt 146 lb 3.2 oz (66.316 kg)  BMI 26.3 kg/m2  SpO2 97% Wt Readings from Last 3 Encounters:  07/07/13 146 lb 3.2 oz (66.316 kg)  01/06/13 164 lb 12.8 oz (74.753 kg)  10/31/12 175 lb (79.379 kg)   Constitutional: She appears well-developed and well-nourished. No distress Neck: Normal range of motion. Neck supple. No JVD present. No thyromegaly present.  Cardiovascular: Normal rate, regular rhythm and normal heart sounds.  No murmur heard. No BLE edema. Pulmonary/Chest: Effort normal and breath sounds normal. No respiratory distress. She has no wheezes.  Psychiatric: She has a normal mood and affect. Her  behavior is normal. Judgment and thought content normal.    Lab Results  Component Value Date   WBC 7.3 07/29/2012   HGB 14.0 07/29/2012   HCT 42.0 07/29/2012   PLT 160.0 07/29/2012   GLUCOSE 132* 07/29/2012   CHOL 182 10/24/2012   TRIG 653.0 Triglyceride is over 400; calculations on Lipids are invalid.* 10/24/2012   HDL 31.90* 10/24/2012   LDLDIRECT 68.9 10/24/2012   ALT 29 10/24/2012   AST 35 10/24/2012   NA 138 07/29/2012   K 4.1 07/29/2012   CL 101 07/29/2012   CREATININE 0.7 07/29/2012   BUN 20 07/29/2012   CO2 26 07/29/2012   TSH 1.61 07/29/2012   HGBA1C 6.5 01/06/2013   MICROALBUR 32.9* 04/15/2010       Assessment & Plan:   see problem list. Medications and labs reviewed today.

## 2013-07-07 NOTE — Patient Instructions (Signed)
It was good to see you today We have reviewed your prior records including labs and tests today Test(s) ordered today. Your results will be released to MyChart (or called to you) after review, usually within 72hours after test completion. If any changes need to be made, you will be notified at that same time. Continue to work on lifestyle changes as discussed (low fat, low carb, increased protein diet; improved exercise efforts; weight loss) to control sugar, blood pressure and cholesterol levels and/or reduce risk of developing other medical problems. Look into [email or web address] or other type of food journal to assist you in this process. Medications reviewed and updated, no changes recommended at this time. Ok to use 0.80ml of Victoza if sugar<100 Please schedule followup in 6 months for diabetes mellitus and cholesterol check, call sooner if problems.

## 2013-07-07 NOTE — Assessment & Plan Note (Signed)
BP Readings from Last 3 Encounters:  07/07/13 132/70  01/06/13 118/72  10/31/12 140/100   On ACEI The current medical regimen is effective;  continue present plan and medications.

## 2013-07-07 NOTE — Assessment & Plan Note (Signed)
Previously on metformin - stopped same 06/2011 when switched to "berberine" natural supplement refused Januvia as rx'd 10/12 Started victoza 10/2012 - doing well  Check a1c now and try to adjust tx prn Lab Results  Component Value Date   HGBA1C 6.5 01/06/2013

## 2013-07-07 NOTE — Assessment & Plan Note (Signed)
Declines rx meds The patient is asked to continue her efforts on diet and exercise patterns to aid in medical management of this problem.

## 2013-07-08 LAB — MICROALBUMIN / CREATININE URINE RATIO
Creatinine,U: 31.9 mg/dL
Microalb, Ur: 1.3 mg/dL (ref 0.0–1.9)

## 2013-08-18 ENCOUNTER — Encounter: Payer: Self-pay | Admitting: Internal Medicine

## 2013-08-18 MED ORDER — PREDNISONE (PAK) 10 MG PO TABS: 10.0000 mg | ORAL_TABLET | ORAL | Status: DC

## 2013-09-01 ENCOUNTER — Encounter: Payer: Self-pay | Admitting: Internal Medicine

## 2013-09-01 MED ORDER — VARENICLINE TARTRATE 1 MG PO TABS: 1.0000 mg | ORAL_TABLET | Freq: Two times a day (BID) | ORAL | Status: DC

## 2013-11-02 ENCOUNTER — Other Ambulatory Visit: Payer: Self-pay | Admitting: Internal Medicine

## 2013-11-14 ENCOUNTER — Other Ambulatory Visit: Payer: Self-pay | Admitting: Internal Medicine

## 2014-02-12 ENCOUNTER — Encounter: Payer: Self-pay | Admitting: Internal Medicine

## 2014-09-07 ENCOUNTER — Ambulatory Visit (INDEPENDENT_AMBULATORY_CARE_PROVIDER_SITE_OTHER): Payer: BC Managed Care – PPO | Admitting: Emergency Medicine

## 2014-09-07 VITALS — BP 140/80 | HR 84 | Temp 98.0°F | Resp 16 | Ht 62.25 in | Wt 154.4 lb

## 2014-09-07 DIAGNOSIS — J018 Other acute sinusitis: Secondary | ICD-10-CM

## 2014-09-07 DIAGNOSIS — J209 Acute bronchitis, unspecified: Secondary | ICD-10-CM

## 2014-09-07 MED ORDER — AMOXICILLIN-POT CLAVULANATE 875-125 MG PO TABS: 1.0000 | ORAL_TABLET | Freq: Two times a day (BID) | ORAL | Status: DC

## 2014-09-07 MED ORDER — ALBUTEROL SULFATE HFA 108 (90 BASE) MCG/ACT IN AERS: 2.0000 | INHALATION_SPRAY | RESPIRATORY_TRACT | Status: DC | PRN

## 2014-09-07 MED ORDER — PSEUDOEPHEDRINE-GUAIFENESIN ER 60-600 MG PO TB12: 1.0000 | ORAL_TABLET | Freq: Two times a day (BID) | ORAL | Status: DC

## 2014-09-07 MED ORDER — PROMETHAZINE-CODEINE 6.25-10 MG/5ML PO SYRP: 5.0000 mL | ORAL_SOLUTION | ORAL | Status: DC | PRN

## 2014-09-07 NOTE — Patient Instructions (Signed)
Metered Dose Inhaler (No Spacer Used)  Inhaled medicines are the basis of treatment for asthma and other breathing problems. Inhaled medicine can only be effective if used properly. Good technique assures that the medicine reaches the lungs.  Metered dose inhalers (MDIs) are used to deliver a variety of inhaled medicines. These include quick relief or rescue medicines (such as bronchodilators) and controller medicines (such as corticosteroids). The medicine is delivered by pushing down on a metal canister to release a set amount of spray.  If you are using different kinds of inhalers, use your quick relief medicine to open the airways 10-15 minutes before using a steroid, if instructed to do so by your health care provider. If you are unsure which inhalers to use and the order of using them, ask your health care provider, nurse, or respiratory therapist.  HOW TO USE THE INHALER  1. Remove the cap from the inhaler.  2. If you are using the inhaler for the first time, you will need to prime it. Shake the inhaler for 5 seconds and release four puffs into the air, away from your face. Ask your health care provider or pharmacist if you have questions about priming your inhaler.  3. Shake the inhaler for 5 seconds before each breath in (inhalation).  4. Position the inhaler so that the top of the canister faces up.  5. Put your index finger on the top of the medicine canister. Your thumb supports the bottom of the inhaler.  6. Open your mouth.  7. Either place the inhaler between your teeth and place your lips tightly around the mouthpiece, or hold the inhaler 1-2 inches away from your open mouth. If you are unsure of which technique to use, ask your health care provider.  8. Breathe out (exhale) normally and as completely as possible.  9. Press the canister down with the index finger to release the medicine.  10. At the same time as the canister is pressed, inhale deeply and slowly until your lungs are completely filled.  This should take 4-6 seconds. Keep your tongue down.  11. Hold the medicine in your lungs for 5-10 seconds (10 seconds is best). This helps the medicine get into the small airways of your lungs.  12. Breathe out slowly, through pursed lips. Whistling is an example of pursed lips.  13. Wait at least 1 minute between puffs. Continue with the above steps until you have taken the number of puffs your health care provider has ordered. Do not use the inhaler more than your health care provider directs you to.  14. Replace the cap on the inhaler.  15. Follow the directions from your health care provider or the inhaler insert for cleaning the inhaler.  If you are using a steroid inhaler, after your last puff, rinse your mouth with water, gargle, and spit out the water. Do not swallow the water.  AVOID:  · Inhaling before or after starting the spray of medicine. It takes practice to coordinate your breathing with triggering the spray.  · Inhaling through the nose (rather than the mouth) when triggering the spray.  HOW TO DETERMINE IF YOUR INHALER IS FULL OR NEARLY EMPTY  You cannot know when an inhaler is empty by shaking it. Some inhalers are now being made with dose counters. Ask your health care provider for a prescription that has a dose counter if you feel you need that extra help. If your inhaler does not have a counter, ask your health care   provider to help you determine the date you need to refill your inhaler. Write the refill date on a calendar or your inhaler canister. Refill your inhaler 7-10 days before it runs out. Be sure to keep an adequate supply of medicine. This includes making sure it has not expired, and making sure you have a spare inhaler.  SEEK MEDICAL CARE IF:  · Symptoms are only partially relieved with your inhaler.  · You are having trouble using your inhaler.  · You experience an increase in phlegm.  SEEK IMMEDIATE MEDICAL CARE IF:  · You feel little or no relief with your inhalers. You are still  wheezing and feeling shortness of breath, tightness in your chest, or both.  · You have dizziness, headaches, or a fast heart rate.  · You have chills, fever, or night sweats.  · There is a noticeable increase in phlegm production, or there is blood in the phlegm.  MAKE SURE YOU:  · Understand these instructions.  · Will watch your condition.  · Will get help right away if you are not doing well or get worse.  Document Released: 09/03/2007 Document Revised: 03/23/2014 Document Reviewed: 04/24/2013  Exit[employer]® Patient Information ©2015 [employer]. This information is not intended to replace advice given to you by your health care provider. Make sure you discuss any questions you have with your health care provider.

## 2014-09-07 NOTE — Progress Notes (Signed)
[facility] [address], [place] [address] [phone]  Date:  09/07/2014   Name:  March RummageRegina Vastine   DOB:  Nov 04, 1961   MRN:  [id number]  PCP:  Rene PaciValerie Leschber, MD    Chief Complaint: Cough   History of Present Illness:  March RummageRegina Strike is a 53 y.o. very pleasant female patient who presents with the following:  Ill since Thursday with nasal congestion and purulent drainage, post nasal drip.  Has a cough that is worse at night.  No wheezing or shortness of breath No fever or chills.   Smokes NIDDM off medications because her A1C was "normal". No improvement with over the counter medications or other home remedies.  Denies other complaint or health concern today.   Patient Active Problem List   Diagnosis Date Noted  . Hypertension   . Personal history of noncompliance with medical treatment 10/31/2012  . OTHER AND UNSPECIFIED HYPERLIPIDEMIA 09/23/2010  . GERD 08/05/2010  . DIABETES MELLITUS, TYPE II 06/30/2010  . TOBACCO USE, QUIT 04/15/2010  . OBESITY 07/25/2009  . SCOLIOSIS 07/25/2009    Past Medical History  Diagnosis Date  . Diabetes mellitus     Type II  . Hyperlipidemia     Mixed  . Obesity   . Scoliosis 1977    remote harrington rod surgery    Past Surgical History  Procedure Laterality Date  . Appendectomy    . Cholecystectomy      History  Substance Use Topics  . Smoking status: Current Every Day Smoker -- 1.00 packs/day    Types: Cigarettes    Last Attempt to Quit: 06/20/2009  . Smokeless tobacco: Not on file     Comment: Regular Exercise - No  . Alcohol Use: No    Family History  Problem Relation Age of Onset  . Dementia Mother     mild-mod  . Heart disease Father     CAD    Allergies  Allergen Reactions  . Influenza Vaccines   . Prednisone     REACTION: angry    Medication list has been reviewed and updated.  Current Outpatient Prescriptions on File Prior to Visit  Medication Sig Dispense Refill  .  lisinopril (PRINIVIL,ZESTRIL) 10 MG tablet TAKE ONE TABLET BY MOUTH EVERY DAY  30 tablet  6  . predniSONE (STERAPRED UNI-PAK) 10 MG tablet Take 1 tablet (10 mg total) by mouth as directed. As directed x 6 days  21 tablet  0  . Ascorbic Acid (VITAMIN C PO) Take by mouth.      . B Complex-Biotin-FA (VITAMIN B50 COMPLEX PO) Take by mouth.      . Flaxseed, Linseed, (FLAX SEED OIL) 1000 MG CAPS Take by mouth daily.      Marland Kitchen. glucose blood (ONE TOUCH ULTRA TEST) test strip 1 each by Other route 2 (two) times daily. Use as instructed       . Gymnema Sylvestris Leaf POWD by Does not apply route daily.        . Insulin Pen Needle 29G X 12MM MISC Use as directed once daily  100 each  3  . magnesium gluconate (MAGONATE) 500 MG tablet Take 500 mg by mouth 2 (two) times daily.      . multivitamin (THERAGRAN) per tablet Take 1 tablet by mouth daily.        . niacin 500 MG tablet Take 1,500 mg by mouth 2 (two) times daily.      . NON FORMULARY Berbero-Gluco  Defense over the counter vitamin take four times a day      . Omega-3 Fatty Acids (RA FISH OIL) 1000 MG CAPS Take 1 capsule by mouth 2 (two) times daily.        . ONE TOUCH LANCETS MISC 1 each by Does not apply route 2 (two) times daily.        . varenicline (CHANTIX CONTINUING MONTH PAK) 1 MG tablet Take 1 tablet (1 mg total) by mouth 2 (two) times daily.  60 tablet  1  . VICTOZA 18 MG/3ML SOPN INJECT 0.1ML(0.6MG ) SUBCUTANEOUSLY ONCE A DAY  3 pen  0  . VITAMIN E PO Take by mouth.       No current facility-administered medications on file prior to visit.    Review of Systems:  As per HPI, otherwise negative.    Physical Examination: Filed Vitals:   09/07/14 1512  BP: 140/80  Pulse: 84  Temp: 98 F (36.7 C)  Resp: 16   Filed Vitals:   09/07/14 1512  Height: 5' 2.25" (1.581 m)  Weight: 154 lb 6.4 oz (70.035 kg)   Body mass index is 28.02 kg/(m^2). Ideal Body Weight: Weight in (lb) to have BMI = 25: 137.5  GEN: WDWN, NAD, Non-toxic, A & O x  3 HEENT: Atraumatic, Normocephalic. Neck supple. No masses, No LAD. Ears and Nose: No external deformity. CV: RRR, No M/G/R. No JVD. No thrill. No extra heart sounds. PULM: diffuse wheezing, wheezes, crackles, rhonchi. No retractions. No resp. distress. No accessory muscle use. ABD: S, NT, ND, +BS. No rebound. No HSM. EXTR: No c/c/e NEURO Normal gait.  PSYCH: Normally interactive. Conversant. Not depressed or anxious appearing.  Calm demeanor.    Assessment and Plan: Sinusitis Bronchitis with bronchospasm NIDDM augmentin mucinex d Phen c cod Albuterol MDI  Signed,  Phillips OdorJeffery Anderson, MD

## 2014-09-09 ENCOUNTER — Telehealth: Payer: Self-pay

## 2014-09-09 NOTE — Telephone Encounter (Signed)
Pt LMOM yesterday which I got this morning. She asked for OOW note for yesterday. I called and LMOM for pt to CB to verify what days she needs a note for and give us her status.

## 2014-09-10 NOTE — Telephone Encounter (Signed)
Called and spoke to pt. She says she does not need the work note anymore.

## 2015-01-15 ENCOUNTER — Telehealth: Payer: Self-pay | Admitting: Internal Medicine

## 2015-01-15 MED ORDER — LISINOPRIL 10 MG PO TABS: 10.0000 mg | ORAL_TABLET | Freq: Every day | ORAL | Status: DC

## 2015-01-15 NOTE — Telephone Encounter (Signed)
Pt called in requesting refill on her lisinopril (PRINIVIL,ZESTRIL) 10 MG tablet [[id number]][[id number]]

## 2015-01-15 NOTE — Telephone Encounter (Signed)
Pt has made appt to see Marcos EkeGreg Calone 02/05/15 will send enough until cpx appt...Raechel Chute/lmb

## 2015-02-05 ENCOUNTER — Encounter: Payer: Self-pay | Admitting: Family

## 2015-02-05 ENCOUNTER — Ambulatory Visit (INDEPENDENT_AMBULATORY_CARE_PROVIDER_SITE_OTHER): Payer: BLUE CROSS/BLUE SHIELD | Admitting: Family

## 2015-02-05 VITALS — BP 110/74 | HR 66 | Temp 98.1°F | Resp 18 | Ht 62.0 in | Wt 167.0 lb

## 2015-02-05 DIAGNOSIS — Z0000 Encounter for general adult medical examination without abnormal findings: Secondary | ICD-10-CM | POA: Insufficient documentation

## 2015-02-05 MED ORDER — VARENICLINE TARTRATE 1 MG PO TABS: 1.0000 mg | ORAL_TABLET | Freq: Two times a day (BID) | ORAL | Status: DC

## 2015-02-05 MED ORDER — LISINOPRIL 10 MG PO TABS: 10.0000 mg | ORAL_TABLET | Freq: Every day | ORAL | Status: DC

## 2015-02-05 MED ORDER — HYDROCORTISONE ACETATE 25 MG RE SUPP
25.0000 mg | Freq: Two times a day (BID) | RECTAL | Status: DC
Start: 2015-02-05 — End: 2016-01-13

## 2015-02-05 MED ORDER — HYDROCORTISONE ACETATE 25 MG RE SUPP: 25.0000 mg | Freq: Two times a day (BID) | RECTAL | Status: DC

## 2015-02-05 NOTE — Progress Notes (Signed)
Subjective:    Patient ID: Deborah Wiggins, female    DOB: 22-Jun-1961, 54 y.o.   MRN: [id number]  Chief Complaint  Patient presents with  . CPE    Not fasting    HPI:  Deborah Wiggins is a 54 y.o. female who presents today for an annual wellness visit.   1) Health Maintenance -   Diet - Averages about  ; dairy, lean meats, vegetables; 6 cups caffeine day  Exercise - Walks all day long; occasional machine at home   2) Preventative Exams / Immunizations:  Dental -- Up to date  Vision -- Up to date    Health Maintenance  Topic Date Due  . HEMOGLOBIN A1C  01/07/2014  . FOOT EXAM  07/07/2014  . URINE MICROALBUMIN  07/07/2014  . PNEUMOCOCCAL POLYSACCHARIDE VACCINE (1) 02/03/2016 (Originally 06/18/1961)  . HIV Screening  02/04/2016 (Originally 06/18/1974)  . OPHTHALMOLOGY EXAM  09/02/2015  . MAMMOGRAM  09/29/2016  . PAP SMEAR  09/09/2017  . COLONOSCOPY  12/25/2019  . TETANUS/TDAP  07/29/2022    Immunization History  Administered Date(s) Administered  . Tdap 07/29/2012    Allergies  Allergen Reactions  . Influenza Vaccines   . Prednisone     REACTION: angry    Current Outpatient Prescriptions on File Prior to Visit  Medication Sig Dispense Refill  . B Complex-Biotin-FA (VITAMIN B50 COMPLEX PO) Take by mouth.    . ESTROGENS CONJUGATED PO Take by mouth every morning.    Marland Kitchen glucose blood (ONE TOUCH ULTRA TEST) test strip 1 each by Other route 2 (two) times daily. Use as instructed     . Insulin Pen Needle 29G X MISC Use as directed once daily 100 each 3  . magnesium gluconate (MAGONATE) 500 MG tablet Take 500 mg by mouth 2 (two) times daily.    . ONE TOUCH LANCETS MISC 1 each by Does not apply route 2 (two) times daily.       No current facility-administered medications on file prior to visit.    Past Medical History  Diagnosis Date  . Diabetes mellitus     Type II  . Hyperlipidemia     Mixed  . Obesity   . Scoliosis 1977    remote harrington rod surgery      Past Surgical History  Procedure Laterality Date  . Cholecystectomy      Family History  Problem Relation Age of Onset  . Dementia Mother     mild-mod  . Heart disease Father     CAD    History   Social History  . Marital Status: Married    Spouse Name: N/A  . Number of Children: 2  . Years of Education: 14   Occupational History  . [employer]    Social History Main Topics  . Smoking status: Current Every Day Smoker -- 1.00 packs/day    Types: Cigarettes    Last Attempt to Quit: 06/20/2009  . Smokeless tobacco: Never Used     Comment: Regular Exercise - No  . Alcohol Use: Yes     Comment: Occasional  . Drug Use: No  . Sexual Activity: Not on file   Other Topics Concern  . Not on file   Social History Narrative   Fun: Gardening, playing cards   Denies religious beliefs that would effect health care.     Review of Systems  Constitutional: Denies fever, chills, fatigue, or significant weight gain/loss. HENT: Head: Denies headache or neck pain Ears:  Denies changes in hearing, ringing in ears, earache, drainage Nose: Denies discharge, stuffiness, itching, nosebleed, sinus pain Throat: Denies sore throat, hoarseness, dry mouth, sores, thrush Eyes: Denies loss/changes in vision, pain, redness, blurry/double vision, flashing lights Cardiovascular: Denies chest pain/discomfort, tightness, palpitations, shortness of breath with activity, difficulty lying down, swelling, sudden awakening with shortness of breath Respiratory: Denies shortness of breath, cough, sputum production, wheezing Gastrointestinal: Denies dysphasia, heartburn, change in appetite, nausea, change in bowel habits, rectal bleeding, constipation, diarrhea, yellow skin or eyes Hemorrhoids Genitourinary: Denies frequency, urgency, burning/pain, blood in urine, incontinence, change in urinary strength. Musculoskeletal: Denies muscle/joint pain, stiffness, back pain, redness or swelling of joints,  trauma Skin: Denies rashes, lumps, itching, dryness, color changes, or hair/nail changes Neurological: Denies dizziness, fainting, seizures, weakness, numbness, tingling, tremor Psychiatric - Denies nervousness, stress, depression or memory loss Endocrine: Denies heat or cold intolerance, sweating, frequent urination, excessive thirst, changes in appetite Hematologic: Denies ease of bruising or bleeding     Objective:     BP 110/74 mmHg  Pulse 66  Temp(Src) 98.1 F (36.7 C) (Oral)  Resp 18  Ht 5\' 2"  (1.575 m)  Wt 167 lb (75.751 kg)  BMI 30.54 kg/m2  SpO2 96% Nursing note and vital signs reviewed.  Physical Exam  Constitutional: She is oriented to person, place, and time. She appears well-developed and well-nourished.  HENT:  Head: Normocephalic.  Right Ear: Hearing, tympanic membrane, external ear and ear canal normal.  Left Ear: Hearing, tympanic membrane, external ear and ear canal normal.  Nose: Nose normal.  Mouth/Throat: Uvula is midline, oropharynx is clear and moist and mucous membranes are normal.  Eyes: Conjunctivae and EOM are normal. Pupils are equal, round, and reactive to light.  Neck: Neck supple. No JVD present. No tracheal deviation present. No thyromegaly present.  Cardiovascular: Normal rate, regular rhythm, normal heart sounds and intact distal pulses.   Pulmonary/Chest: Effort normal and breath sounds normal.  Abdominal: Soft. Bowel sounds are normal. She exhibits no distension and no mass. There is no tenderness. There is no rebound and no guarding.  Musculoskeletal: Normal range of motion. She exhibits no edema or tenderness.  Lymphadenopathy:    She has no cervical adenopathy.  Neurological: She is alert and oriented to person, place, and time. She has normal reflexes. No cranial nerve deficit. She exhibits normal muscle tone. Coordination normal.  Skin: Skin is warm and dry.  Psychiatric: She has a normal mood and affect. Her behavior is normal.  Judgment and thought content normal.       Assessment & Plan:

## 2015-02-05 NOTE — Patient Instructions (Signed)
Thank you for choosing [facility].  Summary/Instructions:  Try colace for stool softeners.   Your prescription(s) have been submitted to your pharmacy or been printed and provided for you. Please take as directed and contact our office if you believe you are having problem(s) with the medication(s) or have any questions.  Please stop by the lab on the basement level of the building for your blood work. Your results will be released to MyChart (or called to you) after review, usually within 72 hours after test completion. If any changes need to be made, you will be notified at that same time.  Hemorrhoids Hemorrhoids are swollen veins around the rectum or anus. There are two types of hemorrhoids:   Internal hemorrhoids. These occur in the veins just inside the rectum. They may poke through to the outside and become irritated and painful.  External hemorrhoids. These occur in the veins outside the anus and can be felt as a painful swelling or hard lump near the anus. CAUSES  Pregnancy.   Obesity.   Constipation or diarrhea.   Straining to have a bowel movement.   Sitting for long periods on the toilet.  Heavy lifting or other activity that caused you to strain.  Anal intercourse. SYMPTOMS   Pain.   Anal itching or irritation.   Rectal bleeding.   Fecal leakage.   Anal swelling.   One or more lumps around the anus.  DIAGNOSIS  Your caregiver may be able to diagnose hemorrhoids by visual examination. Other examinations or tests that may be performed include:   Examination of the rectal area with a gloved hand (digital rectal exam).   Examination of anal canal using a small tube (scope).   A blood test if you have lost a significant amount of blood.  A test to look inside the colon (sigmoidoscopy or colonoscopy). TREATMENT Most hemorrhoids can be treated at home. However, if symptoms do not seem to be getting better or if you have a lot of rectal  bleeding, your caregiver may perform a procedure to help make the hemorrhoids get smaller or remove them completely. Possible treatments include:   Placing a rubber band at the base of the hemorrhoid to cut off the circulation (rubber band ligation).   Injecting a chemical to shrink the hemorrhoid (sclerotherapy).   Using a tool to burn the hemorrhoid (infrared light therapy).   Surgically removing the hemorrhoid (hemorrhoidectomy).   Stapling the hemorrhoid to block blood flow to the tissue (hemorrhoid stapling).  HOME CARE INSTRUCTIONS   Eat foods with fiber, such as whole grains, beans, nuts, fruits, and vegetables. Ask your doctor about taking products with added fiber in them (fibersupplements).  Increase fluid intake. Drink enough water and fluids to keep your urine clear or pale yellow.   Exercise regularly.   Go to the bathroom when you have the urge to have a bowel movement. Do not wait.   Avoid straining to have bowel movements.   Keep the anal area dry and clean. Use wet toilet paper or moist towelettes after a bowel movement.   Medicated creams and suppositories may be used or applied as directed.   Only take over-the-counter or prescription medicines as directed by your caregiver.   Take warm sitz baths for 15-20 minutes, 3-4 times a day to ease pain and discomfort.   Place ice packs on the hemorrhoids if they are tender and swollen. Using ice packs between sitz baths may be helpful.   Put ice in  a plastic bag.   Place a towel between your skin and the bag.   Leave the ice on for 15-20 minutes, 3-4 times a day.   Do not use a donut-shaped pillow or sit on the toilet for long periods. This increases blood pooling and pain.  SEEK MEDICAL CARE IF:  You have increasing pain and swelling that is not controlled by treatment or medicine.  You have uncontrolled bleeding.  You have difficulty or you are unable to have a bowel movement.  You have  pain or inflammation outside the area of the hemorrhoids. MAKE SURE YOU:  Understand these instructions.  Will watch your condition.  Will get help right away if you are not doing well or get worse. Document Released: 11/03/2000 Document Revised: 10/23/2012 Document Reviewed: 09/10/2012 [facility] Patient Information 2015 [place], [place]. This information is not intended to replace advice given to you by your health care provider. Make sure you discuss any questions you have with your health care provider.   Constipation Constipation is when a person has fewer than three bowel movements a week, has difficulty having a bowel movement, or has stools that are dry, hard, or larger than normal. As people grow older, constipation is more common. If you try to fix constipation with medicines that make you have a bowel movement (laxatives), the problem may get worse. Long-term laxative use may cause the muscles of the colon to become weak. A low-fiber diet, not taking in enough fluids, and taking certain medicines may make constipation worse.  CAUSES   Certain medicines, such as antidepressants, pain medicine, iron supplements, antacids, and water pills.   Certain diseases, such as diabetes, irritable bowel syndrome (IBS), thyroid disease, or depression.   Not drinking enough water.   Not eating enough fiber-rich foods.   Stress or travel.   Lack of physical activity or exercise.   Ignoring the urge to have a bowel movement.   Using laxatives too much.  SIGNS AND SYMPTOMS   Having fewer than three bowel movements a week.   Straining to have a bowel movement.   Having stools that are hard, dry, or larger than normal.   Feeling full or bloated.   Pain in the lower abdomen.   Not feeling relief after having a bowel movement.  DIAGNOSIS  Your health care provider will take a medical history and perform a physical exam. Further testing may be done for severe constipation.  Some tests may include:  A barium enema X-ray to examine your rectum, colon, and, sometimes, your small intestine.   A sigmoidoscopy to examine your lower colon.   A colonoscopy to examine your entire colon. TREATMENT  Treatment will depend on the severity of your constipation and what is causing it. Some dietary treatments include drinking more fluids and eating more fiber-rich foods. Lifestyle treatments may include regular exercise. If these diet and lifestyle recommendations do not help, your health care provider may recommend taking over-the-counter laxative medicines to help you have bowel movements. Prescription medicines may be prescribed if over-the-counter medicines do not work.  HOME CARE INSTRUCTIONS   Eat foods that have a lot of fiber, such as fruits, vegetables, whole grains, and beans.  Limit foods high in fat and processed sugars, such as french fries, hamburgers, cookies, candies, and soda.   A fiber supplement may be added to your diet if you cannot get enough fiber from foods.   Drink enough fluids to keep your urine clear or pale yellow.  Exercise regularly or as directed by your health care provider.   Go to the restroom when you have the urge to go. Do not hold it.   Only take over-the-counter or prescription medicines as directed by your health care provider. Do not take other medicines for constipation without talking to your health care provider first.  SEEK IMMEDIATE MEDICAL CARE IF:   You have bright red blood in your stool.   Your constipation lasts for more than 4 days or gets worse.   You have abdominal or rectal pain.   You have thin, pencil-like stools.   You have unexplained weight loss. MAKE SURE YOU:   Understand these instructions.  Will watch your condition.  Will get help right away if you are not doing well or get worse. Document Released: 08/04/2004 Document Revised: 11/11/2013 Document Reviewed: 08/18/2013 [facility]  Patient Information 2015 [place], [place]. This information is not intended to replace advice given to you by your health care provider. Make sure you discuss any questions you have with your health care provider.

## 2015-02-05 NOTE — Assessment & Plan Note (Addendum)
1) Anticipatory Guidance: Discussed importance of wearing a seatbelt while driving and not texting while driving; changing batteries in smoke detector at least once annually; wearing suntan lotion when outside; eating a balanced and moderate diet; getting physical activity at least 30 minutes per day.  2) Immunizations / Screenings / Labs:  Patient declines pneumococcal vaccination. All other vaccinations are up-to-date for recommendations. I'll screenings are up-to-date per recommendations. Obtain CBC, BMET, Lipid profile, A1c and TSH.   Overall well exam. Patient's current cardiovascular risk factors include tobacco use and obesity. She is starting back on Chantix to help her quit smoking. Discussed importance of nutrient dense diet and increasing physical activity to lose approximately 5-10% of body weight. This will help maintain her diabetes. Check A1c to determine diabetic status. Diabetic foot exam completed today. Patient does note a hemorrhoid and would like medication. Start hydrocortisone suppositories as needed. Follow-up pending lab work. Follow-up prevention exam in one year.

## 2015-02-05 NOTE — Progress Notes (Signed)
Pre visit review using our clinic review tool, if applicable. No additional management support is needed unless otherwise documented below in the visit note. 

## 2015-02-08 ENCOUNTER — Telehealth: Payer: Self-pay | Admitting: Family

## 2015-02-08 NOTE — Telephone Encounter (Signed)
emmi mailed  °

## 2015-02-11 ENCOUNTER — Other Ambulatory Visit (INDEPENDENT_AMBULATORY_CARE_PROVIDER_SITE_OTHER): Payer: BLUE CROSS/BLUE SHIELD

## 2015-02-11 DIAGNOSIS — Z0000 Encounter for general adult medical examination without abnormal findings: Secondary | ICD-10-CM | POA: Diagnosis not present

## 2015-02-11 DIAGNOSIS — R7989 Other specified abnormal findings of blood chemistry: Secondary | ICD-10-CM

## 2015-02-11 LAB — BASIC METABOLIC PANEL
BUN: 13 mg/dL (ref 6–23)
CO2: 26 meq/L (ref 19–32)
CREATININE: 0.82 mg/dL (ref 0.40–1.20)
Calcium: 9.9 mg/dL (ref 8.4–10.5)
Chloride: 103 mEq/L (ref 96–112)
GFR: 76.74 mL/min (ref >60.00)
GLUCOSE: 107 mg/dL — AB (ref 70–99)
Potassium: 4.5 mEq/L (ref 3.5–5.1)
SODIUM: 137 meq/L (ref 135–145)

## 2015-02-11 LAB — LDL CHOLESTEROL, DIRECT: Direct LDL: 103 mg/dL

## 2015-02-11 LAB — TSH: TSH: 1.15 u[IU]/mL (ref 0.35–4.50)

## 2015-02-11 LAB — LIPID PANEL
CHOL/HDL RATIO: 6
CHOLESTEROL: 177 mg/dL (ref 0–200)
HDL: 31.2 mg/dL — ABNORMAL LOW (ref >39.00)
NonHDL: 145.8
Triglycerides: 330 mg/dL — ABNORMAL HIGH (ref 0.0–149.0)
VLDL: 66 mg/dL — ABNORMAL HIGH (ref 0.0–40.0)

## 2015-02-11 LAB — CBC
HCT: 49.2 % — ABNORMAL HIGH (ref 36.0–46.0)
Hemoglobin: 17.1 g/dL — ABNORMAL HIGH (ref 12.0–15.0)
MCHC: 34.8 g/dL (ref 30.0–36.0)
MCV: 91 fl (ref 78.0–100.0)
PLATELETS: 197 10*3/uL (ref 150.0–400.0)
RBC: 5.41 Mil/uL — AB (ref 3.87–5.11)
RDW: 13.3 % (ref 11.5–15.5)
WBC: 10.4 10*3/uL (ref 4.0–10.5)

## 2015-02-11 LAB — HEMOGLOBIN A1C: Hgb A1c MFr Bld: 6.3 % (ref 4.6–6.5)

## 2015-02-13 ENCOUNTER — Encounter: Payer: Self-pay | Admitting: Family

## 2015-02-16 ENCOUNTER — Other Ambulatory Visit: Payer: Self-pay

## 2015-02-17 ENCOUNTER — Other Ambulatory Visit: Payer: Self-pay | Admitting: *Deleted

## 2015-02-17 MED ORDER — GLUCOSE BLOOD VI STRP: 1.0000 | ORAL_STRIP | Freq: Two times a day (BID) | Status: AC

## 2015-02-17 NOTE — Telephone Encounter (Signed)
Sent email needing refill on testing strip...Raechel Chute/lmb

## 2015-03-26 ENCOUNTER — Telehealth: Payer: Self-pay | Admitting: Family

## 2015-03-26 ENCOUNTER — Ambulatory Visit: Payer: BLUE CROSS/BLUE SHIELD | Admitting: Family

## 2015-03-26 DIAGNOSIS — Z0289 Encounter for other administrative examinations: Secondary | ICD-10-CM

## 2015-03-26 NOTE — Telephone Encounter (Signed)
Ok to reschedule if she calls back.  

## 2015-03-26 NOTE — Telephone Encounter (Signed)
No showed for acute visit today.  Please advise.

## 2015-03-29 NOTE — Telephone Encounter (Signed)
noted 

## 2015-04-15 ENCOUNTER — Telehealth: Payer: Self-pay | Admitting: Family

## 2015-04-15 NOTE — Telephone Encounter (Signed)
Patient requesting refill for varenicline (CHANTIX CONTINUING MONTH PAK) 1 MG tablet [[id number]][[id number]] . [facility] [address]

## 2015-04-16 MED ORDER — VARENICLINE TARTRATE 1 MG PO TABS: 1.0000 mg | ORAL_TABLET | Freq: Two times a day (BID) | ORAL | Status: DC

## 2015-04-16 NOTE — Telephone Encounter (Signed)
[facility].../lmb 

## 2015-05-21 ENCOUNTER — Telehealth: Payer: Self-pay

## 2015-05-21 NOTE — Telephone Encounter (Signed)
Called patient to see if a recent mammogram has been done. Per patient she has not had a recent mammogram and decline orders. States that whenever she get one that it will go through her GYN.

## 2015-05-31 ENCOUNTER — Other Ambulatory Visit (INDEPENDENT_AMBULATORY_CARE_PROVIDER_SITE_OTHER): Payer: BLUE CROSS/BLUE SHIELD

## 2015-05-31 ENCOUNTER — Encounter: Payer: Self-pay | Admitting: Internal Medicine

## 2015-05-31 ENCOUNTER — Ambulatory Visit (INDEPENDENT_AMBULATORY_CARE_PROVIDER_SITE_OTHER): Payer: BLUE CROSS/BLUE SHIELD | Admitting: Internal Medicine

## 2015-05-31 VITALS — BP 122/88 | HR 82 | Resp 11 | Wt 153.0 lb

## 2015-05-31 DIAGNOSIS — R110 Nausea: Secondary | ICD-10-CM | POA: Diagnosis not present

## 2015-05-31 DIAGNOSIS — K219 Gastro-esophageal reflux disease without esophagitis: Secondary | ICD-10-CM | POA: Diagnosis not present

## 2015-05-31 LAB — COMPREHENSIVE METABOLIC PANEL
ALK PHOS: 62 U/L (ref 39–117)
ALT: 14 U/L (ref 0–35)
AST: 15 U/L (ref 0–37)
Albumin: 4.4 g/dL (ref 3.5–5.2)
BUN: 10 mg/dL (ref 6–23)
CHLORIDE: 98 meq/L (ref 96–112)
CO2: 27 mEq/L (ref 19–32)
CREATININE: 0.73 mg/dL (ref 0.40–1.20)
Calcium: 10.8 mg/dL — ABNORMAL HIGH (ref 8.4–10.5)
GFR: 87.67 mL/min (ref >60.00)
GLUCOSE: 122 mg/dL — AB (ref 70–99)
Potassium: 3.8 mEq/L (ref 3.5–5.1)
Sodium: 134 mEq/L — ABNORMAL LOW (ref 135–145)
Total Bilirubin: 0.4 mg/dL (ref 0.2–1.2)
Total Protein: 7.2 g/dL (ref 6.0–8.3)

## 2015-05-31 LAB — CBC
HCT: 43 % (ref 36.0–46.0)
Hemoglobin: 14.9 g/dL (ref 12.0–15.0)
MCHC: 34.6 g/dL (ref 30.0–36.0)
MCV: 91.2 fl (ref 78.0–100.0)
Platelets: 218 10*3/uL (ref 150.0–400.0)
RBC: 4.71 Mil/uL (ref 3.87–5.11)
RDW: 12.8 % (ref 11.5–15.5)
WBC: 11.3 10*3/uL — AB (ref 4.0–10.5)

## 2015-05-31 LAB — LIPASE: Lipase: 30 U/L (ref 11.0–59.0)

## 2015-05-31 MED ORDER — PANTOPRAZOLE SODIUM 40 MG PO TBEC: 40.0000 mg | DELAYED_RELEASE_TABLET | Freq: Two times a day (BID) | ORAL | Status: DC

## 2015-05-31 MED ORDER — ONDANSETRON HCL 4 MG PO TABS: 4.0000 mg | ORAL_TABLET | Freq: Three times a day (TID) | ORAL | Status: DC | PRN

## 2015-05-31 NOTE — Progress Notes (Signed)
Pre visit review using our clinic review tool, if applicable. No additional management support is needed unless otherwise documented below in the visit note. 

## 2015-05-31 NOTE — Patient Instructions (Signed)
The most common likely thing is a stomach or intestine ulcer. We will check labs to check on the blood counts and to rule out some other possibilities.   We have sent in zofran for nausea which you can take every 6 hours as needed. We have also sent in protonix which is a strong acid blocker. Take 1 pill twice a day (before breakfast and dinner). Give it a couple days to help the stomach. If you are not feeling better and we do not get any good answers with the blood work and you are still having dark stools we may need to do further workup.   Food Choices for Peptic Ulcer Disease When you have peptic ulcer disease, the foods you eat and your eating habits are very important. Choosing the right foods can help ease the discomfort of peptic ulcer disease. WHAT GENERAL GUIDELINES DO I NEED TO FOLLOW?  Choose fruits, vegetables, whole grains, and low-fat meat, fish, and poultry.   Keep a food diary to identify foods that cause symptoms.  Avoid foods that cause irritation or pain. These may be different for different people.  Eat frequent small meals instead of three large meals each day. The pain may be worse when your stomach is empty.  Avoid eating close to bedtime. WHAT FOODS ARE NOT RECOMMENDED? The following are some foods and drinks that may worsen your symptoms:  Black, white, and red pepper.  Hot sauce.  Chili peppers.  Chili powder.  Chocolate and cocoa.   Alcohol.  Tea, coffee, and cola (regular and decaffeinated). The items listed above may not be a complete list of foods and beverages to avoid. Contact your dietitian for more information. Document Released: 01/29/2012 Document Revised: 11/11/2013 Document Reviewed: 09/10/2013 [facility] Patient Information 2015 [place], [place]. This information is not intended to replace advice given to you by your health care provider. Make sure you discuss any questions you have with your health care provider.

## 2015-06-01 NOTE — Assessment & Plan Note (Signed)
This is the most likely diagnosis for her symptoms. Zofran for the nausea so she can eat again. Talked to her about diet for gerd. Suspicion for ulcer and will start PPI twice a day. Call back if no better in 1 week then needs GI referral urgent. Checking CBC (if Hg lower will have her follow up sooner)

## 2015-06-01 NOTE — Progress Notes (Signed)
   Subjective:    Patient ID: Deborah Wiggins, female    DOB: 03/24/1961, 54 y.o.   MRN: [id number]  HPI The patient is a 54 YO female coming in for acute problem of stomach pain and nausea. She has had this for about 1 week. Using tums and milk of magnesia which help some but do not last long. Since this started she has been having some dark stools. No fevers or chills. Pain worse with eating and she has decreased her intake since it started. Some problems like this in the past but not this bad. No SOB or chest pains.   Review of Systems  Constitutional: Positive for appetite change. Negative for fever, activity change, fatigue and unexpected weight change.  Respiratory: Negative.   Cardiovascular: Negative.   Gastrointestinal: Positive for abdominal pain and blood in stool. Negative for nausea, vomiting, diarrhea, constipation and abdominal distention.  Musculoskeletal: Negative.       Objective:   Physical Exam  Constitutional: She is oriented to person, place, and time. She appears well-developed and well-nourished.  HENT:  Head: Normocephalic and atraumatic.  Eyes: EOM are normal.  Cardiovascular: Normal rate and regular rhythm.   Pulmonary/Chest: Effort normal and breath sounds normal.  Abdominal: Soft. Bowel sounds are normal. She exhibits no distension. There is no tenderness. There is no rebound and no guarding.  Neurological: She is alert and oriented to person, place, and time.  Skin: Skin is warm and dry.   Filed Vitals:   05/31/15 1425  BP: 122/88  Pulse: 82  Resp: 11  Weight: 153 lb (69.4 kg)  SpO2: 97%      Assessment & Plan:

## 2015-06-02 ENCOUNTER — Encounter: Payer: Self-pay | Admitting: Internal Medicine

## 2015-06-09 ENCOUNTER — Encounter: Payer: Self-pay | Admitting: Adult Health

## 2015-06-09 ENCOUNTER — Ambulatory Visit (INDEPENDENT_AMBULATORY_CARE_PROVIDER_SITE_OTHER): Payer: PRIVATE HEALTH INSURANCE | Admitting: Adult Health

## 2015-06-09 ENCOUNTER — Other Ambulatory Visit: Payer: BLUE CROSS/BLUE SHIELD

## 2015-06-09 ENCOUNTER — Ambulatory Visit (INDEPENDENT_AMBULATORY_CARE_PROVIDER_SITE_OTHER)
Admission: RE | Admit: 2015-06-09 | Discharge: 2015-06-09 | Disposition: A | Discharge status: Home / Self Care | Payer: PRIVATE HEALTH INSURANCE | Source: Ambulatory Visit | Attending: Adult Health | Admitting: Adult Health

## 2015-06-09 VITALS — BP 132/78 | HR 98 | Temp 98.2°F | Resp 18 | Wt 152.5 lb

## 2015-06-09 DIAGNOSIS — M542 Cervicalgia: Secondary | ICD-10-CM | POA: Diagnosis not present

## 2015-06-09 MED ORDER — BACLOFEN 20 MG PO TABS: 20.0000 mg | ORAL_TABLET | Freq: Three times a day (TID) | ORAL | Status: DC

## 2015-06-09 NOTE — Patient Instructions (Signed)
I will follow up with you regarding your x rays.   Take the muscle relaxer three times a day as needed. Continue take Ibuprofen and use a heating pad.   You will be sore for the next week or so, continue to move around and be active.
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
## 2015-06-10 ENCOUNTER — Encounter: Payer: Self-pay | Admitting: Adult Health

## 2015-06-14 ENCOUNTER — Other Ambulatory Visit: Payer: Self-pay | Admitting: Adult Health

## 2015-06-21 ENCOUNTER — Other Ambulatory Visit: Payer: Self-pay | Admitting: Family

## 2015-06-21 NOTE — Telephone Encounter (Signed)
Patient is requesting refills of chantix to be sent to [facility] on [address]

## 2015-06-23 ENCOUNTER — Other Ambulatory Visit: Payer: Self-pay | Admitting: Family

## 2015-06-23 MED ORDER — VARENICLINE TARTRATE 1 MG PO TABS: 1.0000 mg | ORAL_TABLET | Freq: Two times a day (BID) | ORAL | Status: DC

## 2015-06-23 NOTE — Telephone Encounter (Signed)
Pended rx for your review.

## 2015-06-24 NOTE — Telephone Encounter (Signed)
Please advise, thanks.

## 2015-07-01 LAB — HM DIABETES EYE EXAM

## 2015-07-02 ENCOUNTER — Encounter: Payer: Self-pay | Admitting: Family

## 2015-07-23 ENCOUNTER — Ambulatory Visit: Payer: BLUE CROSS/BLUE SHIELD | Admitting: Family

## 2015-09-01 ENCOUNTER — Other Ambulatory Visit: Payer: Self-pay | Admitting: Family

## 2015-09-03 ENCOUNTER — Encounter: Payer: Self-pay | Admitting: Family

## 2015-09-03 ENCOUNTER — Ambulatory Visit (INDEPENDENT_AMBULATORY_CARE_PROVIDER_SITE_OTHER): Payer: 59 | Admitting: Family

## 2015-09-03 VITALS — BP 122/70 | HR 73 | Temp 98.0°F | Resp 18 | Ht 62.0 in | Wt 156.0 lb

## 2015-09-03 DIAGNOSIS — Z0000 Encounter for general adult medical examination without abnormal findings: Secondary | ICD-10-CM | POA: Diagnosis not present

## 2015-09-03 DIAGNOSIS — E119 Type 2 diabetes mellitus without complications: Secondary | ICD-10-CM

## 2015-09-03 DIAGNOSIS — I499 Cardiac arrhythmia, unspecified: Secondary | ICD-10-CM | POA: Diagnosis not present

## 2015-09-03 NOTE — Progress Notes (Signed)
Pre visit review using our clinic review tool, if applicable. No additional management support is needed unless otherwise documented below in the visit note. 

## 2015-09-03 NOTE — Patient Instructions (Signed)
Thank you for choosing [facility].  Summary/Instructions:  Your prescription(s) have been submitted to your pharmacy or been printed and provided for you. Please take as directed and contact our office if you believe you are having problem(s) with the medication(s) or have any questions.  Please stop by the lab on the basement level of the building for your blood work. Your results will be released to [place] (or called to you) after review, usually within 72 hours after test completion. If any changes need to be made, you will be notified at that same time.  PIf your symptoms worsen or fail to improve, please contact our office for further instruction, or in case of emergency go directly to the emergency room at the closest medical facility.   Health Maintenance, Female Adopting a healthy lifestyle and getting preventive care can go a long way to promote health and wellness. Talk with your health care provider about what schedule of regular examinations is right for you. This is a good chance for you to check in with your provider about disease prevention and staying healthy. In between checkups, there are plenty of things you can do on your own. Experts have done a lot of research about which lifestyle changes and preventive measures are most likely to keep you healthy. Ask your health care provider for more information. WEIGHT AND DIET  Eat a healthy diet  Be sure to include plenty of vegetables, fruits, low-fat dairy products, and lean protein.  Do not eat a lot of foods high in solid fats, added sugars, or salt.  Get regular exercise. This is one of the most important things you can do for your health.  Most adults should exercise for at least 150 minutes each week. The exercise should increase your heart rate and make you sweat (moderate-intensity exercise).  Most adults should also do strengthening exercises at least twice a week. This is in addition to the moderate-intensity  exercise.  Maintain a healthy weight  Body mass index (BMI) is a measurement that can be used to identify possible weight problems. It estimates body fat based on height and weight. Your health care provider can help determine your BMI and help you achieve or maintain a healthy weight.  For females 109 years of age and older:   A BMI below 18.5 is considered underweight.  A BMI of 18.5 to 24.9 is normal.  A BMI of 25 to 29.9 is considered overweight.  A BMI of 30 and above is considered obese.  Watch levels of cholesterol and blood lipids  You should start having your blood tested for lipids and cholesterol at 56 years of age, then have this test every 5 years.  You may need to have your cholesterol levels checked more often if:  Your lipid or cholesterol levels are high.  You are older than 56 years of age.  You are at high risk for heart disease.  CANCER SCREENING   Lung Cancer  Lung cancer screening is recommended for adults 8-47 years old who are at high risk for lung cancer because of a history of smoking.  A yearly low-dose CT scan of the lungs is recommended for people who:  Currently smoke.  Have quit within the past 15 years.  Have at least a 30-pack-year history of smoking. A pack year is smoking an average of one pack of cigarettes a day for 1 year.  Yearly screening should continue until it has been 15 years since you quit.  Yearly screening should stop if you develop a health problem that would prevent you from having lung cancer treatment.  Breast Cancer  Practice breast self-awareness. This means understanding how your breasts normally appear and feel.  It also means doing regular breast self-exams. Let your health care provider know about any changes, no matter how small.  If you are in your 20s or 30s, you should have a clinical breast exam (CBE) by a health care provider every 1-3 years as part of a regular health exam.  If you are 33 or  older, have a CBE every year. Also consider having a breast X-ray (mammogram) every year.  If you have a family history of breast cancer, talk to your health care provider about genetic screening.  If you are at high risk for breast cancer, talk to your health care provider about having an MRI and a mammogram every year.  Breast cancer gene (BRCA) assessment is recommended for women who have family members with BRCA-related cancers. BRCA-related cancers include:  Breast.  Ovarian.  Tubal.  Peritoneal cancers.  Results of the assessment will determine the need for genetic counseling and BRCA1 and BRCA2 testing. Cervical Cancer Your health care provider may recommend that you be screened regularly for cancer of the pelvic organs (ovaries, uterus, and vagina). This screening involves a pelvic examination, including checking for microscopic changes to the surface of your cervix (Pap test). You may be encouraged to have this screening done every 3 years, beginning at age 33.  For women ages 40-65, health care providers may recommend pelvic exams and Pap testing every 3 years, or they may recommend the Pap and pelvic exam, combined with testing for human papilloma virus (HPV), every 5 years. Some types of HPV increase your risk of cervical cancer. Testing for HPV may also be done on women of any age with unclear Pap test results.  Other health care providers may not recommend any screening for nonpregnant women who are considered low risk for pelvic cancer and who do not have symptoms. Ask your health care provider if a screening pelvic exam is right for you.  If you have had past treatment for cervical cancer or a condition that could lead to cancer, you need Pap tests and screening for cancer for at least 20 years after your treatment. If Pap tests have been discontinued, your risk factors (such as having a new sexual partner) need to be reassessed to determine if screening should resume. Some  women have medical problems that increase the chance of getting cervical cancer. In these cases, your health care provider may recommend more frequent screening and Pap tests. Colorectal Cancer  This type of cancer can be detected and often prevented.  Routine colorectal cancer screening usually begins at 56 years of age and continues through 56 years of age.  Your health care provider may recommend screening at an earlier age if you have risk factors for colon cancer.  Your health care provider may also recommend using home test kits to check for hidden blood in the stool.  A small camera at the end of a tube can be used to examine your colon directly (sigmoidoscopy or colonoscopy). This is done to check for the earliest forms of colorectal cancer.  Routine screening usually begins at age 15.  Direct examination of the colon should be repeated every 5-10 years through 56 years of age. However, you may need to be screened more often if early forms of precancerous polyps or  small growths are found. Skin Cancer  Check your skin from head to toe regularly.  Tell your health care provider about any new moles or changes in moles, especially if there is a change in a mole's shape or color.  Also tell your health care provider if you have a mole that is larger than the size of a pencil eraser.  Always use sunscreen. Apply sunscreen liberally and repeatedly throughout the day.  Protect yourself by wearing long sleeves, pants, a wide-brimmed hat, and sunglasses whenever you are outside. HEART DISEASE, DIABETES, AND HIGH BLOOD PRESSURE   High blood pressure causes heart disease and increases the risk of stroke. High blood pressure is more likely to develop in:  People who have blood pressure in the high end of the normal range (130-139/85-89 mm Hg).  People who are overweight or obese.  People who are African American.  If you are 18-39 years of age, have your blood pressure checked every  3-5 years. If you are 40 years of age or older, have your blood pressure checked every year. You should have your blood pressure measured twice--once when you are at a hospital or clinic, and once when you are not at a hospital or clinic. Record the average of the two measurements. To check your blood pressure when you are not at a hospital or clinic, you can use:  An automated blood pressure machine at a pharmacy.  A home blood pressure monitor.  If you are between 55 years and 79 years old, ask your health care provider if you should take aspirin to prevent strokes.  Have regular diabetes screenings. This involves taking a blood sample to check your fasting blood sugar level.  If you are at a normal weight and have a low risk for diabetes, have this test once every three years after 56 years of age.  If you are overweight and have a high risk for diabetes, consider being tested at a younger age or more often. PREVENTING INFECTION  Hepatitis B  If you have a higher risk for hepatitis B, you should be screened for this virus. You are considered at high risk for hepatitis B if:  You were born in a country where hepatitis B is common. Ask your health care provider which countries are considered high risk.  Your parents were born in a high-risk country, and you have not been immunized against hepatitis B (hepatitis B vaccine).  You have HIV or AIDS.  You use needles to inject street drugs.  You live with someone who has hepatitis B.  You have had sex with someone who has hepatitis B.  You get hemodialysis treatment.  You take certain medicines for conditions, including cancer, organ transplantation, and autoimmune conditions. Hepatitis C  Blood testing is recommended for:  Everyone born from 1945 through 1965.  Anyone with known risk factors for hepatitis C. Sexually transmitted infections (STIs)  You should be screened for sexually transmitted infections (STIs) including  gonorrhea and chlamydia if:  You are sexually active and are younger than 56 years of age.  You are older than 56 years of age and your health care provider tells you that you are at risk for this type of infection.  Your sexual activity has changed since you were last screened and you are at an increased risk for chlamydia or gonorrhea. Ask your health care provider if you are at risk.  If you do not have HIV, but are at risk, it may be   recommended that you take a prescription medicine daily to prevent HIV infection. This is called pre-exposure prophylaxis (PrEP). You are considered at risk if:  You are sexually active and do not regularly use condoms or know the HIV status of your partner(s).  You take drugs by injection.  You are sexually active with a partner who has HIV. Talk with your health care provider about whether you are at high risk of being infected with HIV. If you choose to begin PrEP, you should first be tested for HIV. You should then be tested every 3 months for as long as you are taking PrEP.  PREGNANCY   If you are premenopausal and you may become pregnant, ask your health care provider about preconception counseling.  If you may become pregnant, take 400 to 800 micrograms (mcg) of folic acid every day.  If you want to prevent pregnancy, talk to your health care provider about birth control (contraception). OSTEOPOROSIS AND MENOPAUSE   Osteoporosis is a disease in which the bones lose minerals and strength with aging. This can result in serious bone fractures. Your risk for osteoporosis can be identified using a bone density scan.  If you are 65 years of age or older, or if you are at risk for osteoporosis and fractures, ask your health care provider if you should be screened.  Ask your health care provider whether you should take a calcium or vitamin D supplement to lower your risk for osteoporosis.  Menopause may have certain physical symptoms and  risks.  Hormone replacement therapy may reduce some of these symptoms and risks. Talk to your health care provider about whether hormone replacement therapy is right for you.  HOME CARE INSTRUCTIONS   Schedule regular health, dental, and eye exams.  Stay current with your immunizations.   Do not use any tobacco products including cigarettes, chewing tobacco, or electronic cigarettes.  If you are pregnant, do not drink alcohol.  If you are breastfeeding, limit how much and how often you drink alcohol.  Limit alcohol intake to no more than 1 drink per day for nonpregnant women. One drink equals 12 ounces of beer, 5 ounces of wine, or 1 ounces of hard liquor.  Do not use street drugs.  Do not share needles.  Ask your health care provider for help if you need support or information about quitting drugs.  Tell your health care provider if you often feel depressed.  Tell your health care provider if you have ever been abused or do not feel safe at home.   This information is not intended to replace advice given to you by your health care provider. Make sure you discuss any questions you have with your health care provider.   Document Released: 05/22/2011 Document Revised: 11/27/2014 Document Reviewed: 10/08/2013 Elsevier Interactive Patient Education 2016 Elsevier Inc.   

## 2015-09-03 NOTE — Progress Notes (Signed)
Subjective:    Patient ID: Deborah Wiggins, female    DOB: 07/29/1961, 54 y.o.   MRN: [id number]  Chief Complaint  Patient presents with  . CPE    Not fasting    HPI:  Deborah Wiggins is a 54 y.o. female who presents today for a wellness exam.   1) Health Maintenance -   Diet - Averages about 2-3 meals per day consisting of fruit, vegetables, chicken, and yogurt; 6-7 cups of caffeine through soda  Exercise - Walks her dogs daily  2) Preventative Exams / Immunizations:  Dental -- Up to date  Vision -- Up to date   Health Maintenance  Topic Date Due  . Hepatitis C Screening  06-02-1961  . URINE MICROALBUMIN  07/07/2014  . HEMOGLOBIN A1C  08/14/2015  . PNEUMOCOCCAL POLYSACCHARIDE VACCINE (1) 02/03/2016 (Originally 06/18/1961)  . HIV Screening  02/04/2016 (Originally 06/18/1974)  . FOOT EXAM  02/05/2016  . OPHTHALMOLOGY EXAM  06/30/2016  . MAMMOGRAM  09/29/2016  . PAP SMEAR  09/09/2017  . COLONOSCOPY  12/25/2019  . TETANUS/TDAP  07/29/2022    Immunization History  Administered Date(s) Administered  . Tdap 07/29/2012   Allergies  Allergen Reactions  . Influenza Vaccines   . Prednisone     REACTION: angry     Outpatient Prescriptions Prior to Visit  Medication Sig Dispense Refill  . B Complex-Biotin-FA (VITAMIN B50 COMPLEX PO) Take by mouth.    . CHANTIX CONTINUING MONTH PAK 1 MG tablet TAKE ONE TABLET BY MOUTH TWICE DAILY 56 tablet 0  . ESTROGENS CONJUGATED PO Take by mouth every morning.    Marland Kitchen glucose blood (ONE TOUCH ULTRA TEST) test strip 1 each by Other route 2 (two) times daily. Use to check blood sugars twice a day Dx E11.9 100 each 3  . hydrocortisone (ANUSOL-HC) 25 MG suppository Place 1 suppository (25 mg total) rectally 2 (two) times daily. 12 suppository 0  . Insulin Pen Needle 29G X MISC Use as directed once daily 100 each 3  . lisinopril (PRINIVIL,ZESTRIL) 10 MG tablet TAKE ONE TABLET BY MOUTH ONCE DAILY 90 tablet 0  . magnesium gluconate  (MAGONATE) 500 MG tablet Take 500 mg by mouth 2 (two) times daily.    . ondansetron (ZOFRAN) 4 MG tablet Take 1 tablet (4 mg total) by mouth every 8 (eight) hours as needed for nausea or vomiting. 20 tablet 0  . ONE TOUCH LANCETS MISC 1 each by Does not apply route 2 (two) times daily.      . phentermine 37.5 MG capsule Take 37.5 mg by mouth every morning.    . zolpidem (AMBIEN) 10 MG tablet Take 10 mg by mouth at bedtime.    . baclofen (LIORESAL) 20 MG tablet Take 1 tablet (20 mg total) by mouth 3 (three) times daily. 30 each 0  . pantoprazole (PROTONIX) 40 MG tablet Take 1 tablet (40 mg total) by mouth 2 (two) times daily. 60 tablet 0  . varenicline (CHANTIX CONTINUING MONTH PAK) 1 MG tablet Take 1 tablet (1 mg total) by mouth 2 (two) times daily. 60 tablet 1   No facility-administered medications prior to visit.     Past Medical History  Diagnosis Date  . Diabetes mellitus     Type II  . Hyperlipidemia     Mixed  . Obesity   . Scoliosis 1977    remote harrington rod surgery     Past Surgical History  Procedure Laterality Date  . Cholecystectomy  Family History  Problem Relation Age of Onset  . Dementia Mother     mild-mod  . Heart disease Father     CAD     Social History   Social History  . Marital Status: Married    Spouse Name: N/A  . Number of Children: 2  . Years of Education: 14   Occupational History  . Lab [employer]    Social History Main Topics  . Smoking status: Current Every Day Smoker -- 1.00 packs/day    Types: Cigarettes    Last Attempt to Quit: 06/20/2009  . Smokeless tobacco: Never Used     Comment: Regular Exercise - No  . Alcohol Use: Yes     Comment: Occasional  . Drug Use: No  . Sexual Activity: Not on file   Other Topics Concern  . Not on file   Social History Narrative   Fun: Gardening, playing cards   Denies religious beliefs that would effect health care.    Feels safe at home and denies abuse    Review of Systems    Constitutional: Denies fever, chills, fatigue, or significant weight gain/loss. HENT: Head: Denies headache or neck pain Ears: Denies changes in hearing, ringing in ears, earache, drainage Nose: Denies discharge, stuffiness, itching, nosebleed, sinus pain Throat: Denies sore throat, hoarseness, dry mouth, sores, thrush Eyes: Denies loss/changes in vision, pain, redness, blurry/double vision, flashing lights Cardiovascular: Denies chest pain/discomfort, tightness, palpitations, shortness of breath with activity, difficulty lying down, swelling, sudden awakening with shortness of breath Respiratory: Denies shortness of breath, cough, sputum production, wheezing Gastrointestinal: Denies dysphasia, heartburn, change in appetite, nausea, change in bowel habits, rectal bleeding, constipation, diarrhea, yellow skin or eyes Genitourinary: Denies frequency, urgency, burning/pain, blood in urine, incontinence, change in urinary strength. Musculoskeletal: Denies muscle/joint pain, stiffness, back pain, redness or swelling of joints, trauma Skin: Denies rashes, lumps, itching, dryness, color changes, or hair/nail changes Neurological: Denies dizziness, fainting, seizures, weakness, numbness, tingling, tremor Psychiatric - Denies nervousness, stress, depression or memory loss Endocrine: Denies heat or cold intolerance, sweating, frequent urination, excessive thirst, changes in appetite Hematologic: Denies ease of bruising or bleeding     Objective:     BP 122/70 mmHg  Pulse 73  Temp(Src) 98 F (36.7 C) (Oral)  Resp 18  Ht  (1.575 m)  Wt 156 lb (70.761 kg)  BMI 28.53 kg/m2  SpO2 95% Nursing note and vital signs reviewed.  Depression screen PHQ 2/9 09/03/2015  Decreased Interest 0  Down, Depressed, Hopeless 0  PHQ - 2 Score 0   Physical Exam  Constitutional: She is oriented to person, place, and time. She appears well-developed and well-nourished.  HENT:  Head: Normocephalic.  Right  Ear: Hearing, tympanic membrane, external ear and ear canal normal.  Left Ear: Hearing, tympanic membrane, external ear and ear canal normal.  Nose: Nose normal.  Mouth/Throat: Uvula is midline, oropharynx is clear and moist and mucous membranes are normal.  Eyes: Conjunctivae and EOM are normal. Pupils are equal, round, and reactive to light.  Neck: Neck supple. No JVD present. No tracheal deviation present. No thyromegaly present.  Cardiovascular: Normal rate and intact distal pulses.  Frequent extrasystoles are present.  Pulmonary/Chest: Effort normal and breath sounds normal.  Abdominal: Soft. Bowel sounds are normal. She exhibits no distension and no mass. There is no tenderness. There is no rebound and no guarding.  Musculoskeletal: Normal range of motion. She exhibits no edema or tenderness.  Lymphadenopathy:    She has  no cervical adenopathy.  Neurological: She is alert and oriented to person, place, and time. She has normal reflexes. No cranial nerve deficit. She exhibits normal muscle tone. Coordination normal.  Skin: Skin is warm and dry.  Psychiatric: She has a normal mood and affect. Her behavior is normal. Judgment and thought content normal.       Assessment & Plan:   Problem List Items Addressed This Visit      Endocrine   Type 2 diabetes mellitus (HCC)   Relevant Orders   Hemoglobin A1c     Other   Routine general medical examination at a health care facility - Primary    1) Anticipatory Guidance: Discussed importance of wearing a seatbelt while driving and not texting while driving; changing batteries in smoke detector at least once annually; wearing suntan lotion when outside; eating a balanced and moderate diet; getting physical activity at least 30 minutes per day.  2) Immunizations / Screenings / Labs:  Declines pneumococcal vaccination. Influenza vaccination contraindicated secondary to allergy. All other immunizations are up-to-date per recommendation. Due for  hemoglobin A1c for diabetes screening-obtain A1c. All other screenings are up-to-date per recommendations. Obtain CBC, BMET, Lipid profile and TSH.   Overall well exam with risk factors for cardiovascular disease including overweight, hypertension, hyperlipidemia, and mildly active lifestyle. Recommend establishing goal of 5-7% weight loss of current weight through increasing physical activity and increasing nutrient density and decreasing saturated/transfer and processed foods. Hypertension is adequately controlled with current regimen. Hyperlipidemia not currently maintained on medications. We will recheck lipid profile and possible need for statin secondary to diabetes. In office EKG reveals normal sinus rhythm with occasional extra heartbeat. Continue to monitor in the future. Continue healthy lifestyle behaviors and choices. Follow up prevention exam in 1 year. Follow-up office visit pending lab work.       Blood tests for routine general physical examination   Relevant Orders   CBC   Comprehensive metabolic panel   Lipid panel   TSH   Hemoglobin A1c    Other Visit Diagnoses    Irregular heartbeat        Relevant Orders    EKG 12-Lead (Completed)

## 2015-09-03 NOTE — Assessment & Plan Note (Signed)
1) Anticipatory Guidance: Discussed importance of wearing a seatbelt while driving and not texting while driving; changing batteries in smoke detector at least once annually; wearing suntan lotion when outside; eating a balanced and moderate diet; getting physical activity at least 30 minutes per day.  2) Immunizations / Screenings / Labs:  Declines pneumococcal vaccination. Influenza vaccination contraindicated secondary to allergy. All other immunizations are up-to-date per recommendation. Due for hemoglobin A1c for diabetes screening-obtain A1c. All other screenings are up-to-date per recommendations. Obtain CBC, BMET, Lipid profile and TSH.   Overall well exam with risk factors for cardiovascular disease including overweight, hypertension, hyperlipidemia, and mildly active lifestyle. Recommend establishing goal of 5-7% weight loss of current weight through increasing physical activity and increasing nutrient density and decreasing saturated/transfer and processed foods. Hypertension is adequately controlled with current regimen. Hyperlipidemia not currently maintained on medications. We will recheck lipid profile and possible need for statin secondary to diabetes. In office EKG reveals normal sinus rhythm with occasional extra heartbeat. Continue to monitor in the future. Continue healthy lifestyle behaviors and choices. Follow up prevention exam in 1 year. Follow-up office visit pending lab work.

## 2015-09-22 ENCOUNTER — Telehealth: Payer: Self-pay | Admitting: *Deleted

## 2015-09-22 MED ORDER — VARENICLINE TARTRATE 1 MG PO TABS: 1.0000 mg | ORAL_TABLET | Freq: Two times a day (BID) | ORAL | Status: DC

## 2015-09-22 NOTE — Telephone Encounter (Signed)
Left msg on triage stating needing refill on her Chantix. Sent electronically...Raechel Chute/lmb

## 2015-09-28 ENCOUNTER — Telehealth: Payer: Self-pay

## 2015-09-28 NOTE — Telephone Encounter (Signed)
PA approved via covermymeds. ZOX:[id number]:[id number]

## 2015-11-18 ENCOUNTER — Other Ambulatory Visit: Payer: Self-pay | Admitting: Family

## 2015-12-17 ENCOUNTER — Other Ambulatory Visit: Payer: Self-pay | Admitting: Family

## 2015-12-17 NOTE — Telephone Encounter (Signed)
Please advise. Thanks.  

## 2016-01-13 ENCOUNTER — Encounter: Payer: Self-pay | Admitting: Family

## 2016-01-13 ENCOUNTER — Other Ambulatory Visit (INDEPENDENT_AMBULATORY_CARE_PROVIDER_SITE_OTHER): Payer: 59

## 2016-01-13 ENCOUNTER — Ambulatory Visit (INDEPENDENT_AMBULATORY_CARE_PROVIDER_SITE_OTHER): Payer: 59 | Admitting: Family

## 2016-01-13 VITALS — BP 136/84 | HR 78 | Temp 98.2°F | Resp 16 | Ht 62.0 in | Wt 164.0 lb

## 2016-01-13 DIAGNOSIS — E119 Type 2 diabetes mellitus without complications: Secondary | ICD-10-CM

## 2016-01-13 DIAGNOSIS — E785 Hyperlipidemia, unspecified: Secondary | ICD-10-CM

## 2016-01-13 LAB — LIPID PANEL
CHOL/HDL RATIO: 8
CHOLESTEROL: 231 mg/dL — AB (ref 0–200)
HDL: 28.4 mg/dL — AB (ref >39.00)
Triglycerides: 1759 mg/dL — ABNORMAL HIGH (ref 0.0–149.0)

## 2016-01-13 LAB — BASIC METABOLIC PANEL
BUN: 11 mg/dL (ref 6–23)
CALCIUM: 9.7 mg/dL (ref 8.4–10.5)
CO2: 27 mEq/L (ref 19–32)
CREATININE: 0.86 mg/dL (ref 0.40–1.20)
Chloride: 97 mEq/L (ref 96–112)
GFR: 72.4 mL/min (ref >60.00)
Glucose, Bld: 269 mg/dL — ABNORMAL HIGH (ref 70–99)
Potassium: 4.3 mEq/L (ref 3.5–5.1)
Sodium: 133 mEq/L — ABNORMAL LOW (ref 135–145)

## 2016-01-13 LAB — HEMOGLOBIN A1C: HEMOGLOBIN A1C: 9.6 % — AB (ref 4.6–6.5)

## 2016-01-13 LAB — LDL CHOLESTEROL, DIRECT: LDL DIRECT: 68 mg/dL

## 2016-01-13 NOTE — Progress Notes (Signed)
Subjective:    Patient ID: Deborah Wiggins, female    DOB: 01-05-1961, 55 y.o.   MRN: [id number]  Chief Complaint  Patient presents with  . Follow-up    HPI:  Deborah Wiggins is a 55 y.o. female who  has a past medical history of Diabetes mellitus; Hyperlipidemia; Obesity; and Scoliosis (1977). and presents today for a follow up office visit.   1.) Type 2 diabetes - Currently managed with lifestyle management. Recently seen in GYN and was noted to have have glycosuria. She was advised to follow up with primary care. Reports her vision may be a little bit weaker. Describes that she is always thirsty and does have to urinate frequency. Not currently taking her blood sugar at home.   Lab Results  Component Value Date   HGBA1C 6.3 02/11/2015   2.) Hyperlipidemia - Noted to have elevated lipids on previous blood work and is currently managed through lifestyle.   Allergies  Allergen Reactions  . Influenza Vaccines   . Prednisone     REACTION: angry     Current Outpatient Prescriptions on File Prior to Visit  Medication Sig Dispense Refill  . B Complex-Biotin-FA (VITAMIN B50 COMPLEX PO) Take by mouth.    . CHANTIX CONTINUING MONTH PAK 1 MG tablet TAKE ONE TABLET BY MOUTH TWICE DAILY 56 tablet 0  . ESTROGENS CONJUGATED PO Take by mouth every morning.    Marland Kitchen glucose blood (ONE TOUCH ULTRA TEST) test strip 1 each by Other route 2 (two) times daily. Use to check blood sugars twice a day Dx E11.9 100 each 3  . lisinopril (PRINIVIL,ZESTRIL) 10 MG tablet TAKE ONE TABLET BY MOUTH ONCE DAILY 90 tablet 0  . magnesium gluconate (MAGONATE) 500 MG tablet Take 500 mg by mouth 2 (two) times daily.    . ONE TOUCH LANCETS MISC 1 each by Does not apply route 2 (two) times daily.      Marland Kitchen zolpidem (AMBIEN) 10 MG tablet Take 10 mg by mouth at bedtime.     No current facility-administered medications on file prior to visit.     Past Surgical History  Procedure Laterality Date  . Cholecystectomy        Review of Systems  Constitutional: Negative for fever and chills.  Eyes:       Positive for mild changes in vision.   Respiratory: Negative for chest tightness and shortness of breath.   Cardiovascular: Negative for chest pain, palpitations and leg swelling.  Endocrine: Positive for polydipsia, polyphagia and polyuria.  Neurological: Negative for weakness and numbness.      Objective:    BP 136/84 mmHg  Pulse 78  Temp(Src) 98.2 F (36.8 C) (Oral)  Resp 16  Ht  (1.575 m)  Wt 164 lb (74.39 kg)  BMI 29.99 kg/m2  SpO2 96% Nursing note and vital signs reviewed.  Physical Exam  Constitutional: She is oriented to person, place, and time. She appears well-developed and well-nourished. No distress.  Cardiovascular: Normal rate, regular rhythm, normal heart sounds and intact distal pulses.   Pulmonary/Chest: Effort normal and breath sounds normal.  Neurological: She is alert and oriented to person, place, and time.  Skin: Skin is warm and dry.  Psychiatric: She has a normal mood and affect. Her behavior is normal. Judgment and thought content normal.       Assessment & Plan:   Problem List Items Addressed This Visit      Endocrine   Type 2 diabetes mellitus (HCC) -  Primary     Expressing symptoms of increased blood sugar and noted to have glycosuria during most recent GYN exam. Obtain A1c and BMET. Discussed importance of nutrition and exercise and maintaining blood sugars. Follow-up treatment pending A1c results.      Relevant Orders   Hemoglobin A1c   Basic Metabolic Panel (BMET)     Other   Hyperlipidemia     Not currently maintained on medications. Obtain lipid profile to determine current status. Follow-up and treatment pending lipid profile results.      Relevant Orders   Lipid Profile

## 2016-01-13 NOTE — Assessment & Plan Note (Signed)
Not currently maintained on medications. Obtain lipid profile to determine current status. Follow-up and treatment pending lipid profile results.

## 2016-01-13 NOTE — Assessment & Plan Note (Addendum)
Expressing symptoms of increased blood sugar and noted to have glycosuria during most recent GYN exam. Obtain A1c and BMET. Discussed importance of nutrition and exercise and maintaining blood sugars. Follow-up treatment pending A1c results.

## 2016-01-13 NOTE — Patient Instructions (Signed)
Thank you for choosing [facility].  Summary/Instructions:  Please stop by the lab on the basement level of the building for your blood work. Your results will be released to MyChart (or called to you) after review, usually within 72 hours after test completion. If any changes need to be made, you will be notified at that same time.  Please stop by radiology on the basement level of the building for your x-rays. Your results will be released to MyChart (or called to you) after review, usually within 72 hours after test completion. If any treatments or changes are necessary, you will be notified at that same time.  If your symptoms worsen or fail to improve, please contact our office for further instruction, or in case of emergency go directly to the emergency room at the closest medical facility.     

## 2016-01-13 NOTE — Progress Notes (Signed)
Pre visit review using our clinic review tool, if applicable. No additional management support is needed unless otherwise documented below in the visit note. 

## 2016-01-16 ENCOUNTER — Encounter: Payer: Self-pay | Admitting: Family

## 2016-01-16 DIAGNOSIS — E1165 Type 2 diabetes mellitus with hyperglycemia: Principal | ICD-10-CM

## 2016-01-16 DIAGNOSIS — IMO0001 Reserved for inherently not codable concepts without codable children: Secondary | ICD-10-CM

## 2016-01-16 DIAGNOSIS — E781 Pure hyperglyceridemia: Secondary | ICD-10-CM

## 2016-01-18 ENCOUNTER — Encounter: Payer: Self-pay | Admitting: Family

## 2016-01-20 NOTE — Telephone Encounter (Signed)
Please call pt

## 2016-01-21 ENCOUNTER — Telehealth: Payer: Self-pay | Admitting: Family

## 2016-01-21 NOTE — Telephone Encounter (Signed)
Pt called to follow up on the email she sent about the medication.  Can you please call her

## 2016-01-21 NOTE — Telephone Encounter (Signed)
Please advise on her email

## 2016-01-23 MED ORDER — ICOSAPENT ETHYL 1 G PO CAPS: 2.0000 g | ORAL_CAPSULE | Freq: Two times a day (BID) | ORAL | Status: DC

## 2016-01-23 MED ORDER — INSULIN DEGLUDEC 100 UNIT/ML ~~LOC~~ SOPN: 15.0000 [IU] | PEN_INJECTOR | Freq: Every day | SUBCUTANEOUS | Status: DC

## 2016-01-23 MED ORDER — INSULIN PEN NEEDLE 31G X 8 MM MISC: Status: AC

## 2016-01-23 MED ORDER — SITAGLIPTIN PHOSPHATE 100 MG PO TABS: 100.0000 mg | ORAL_TABLET | Freq: Every day | ORAL | Status: DC

## 2016-01-24 ENCOUNTER — Encounter: Payer: Self-pay | Admitting: Family

## 2016-01-24 ENCOUNTER — Telehealth: Payer: Self-pay | Admitting: Family

## 2016-01-24 NOTE — Telephone Encounter (Signed)
Please inform patient that I do have some samples of Vascepa and [place]reisba that she can use to get started. We still do have to go through prior authorization to have the meds covered.

## 2016-01-24 NOTE — Telephone Encounter (Signed)
Pt called stated pharmacy told her that Januvia, Evaristo Buryresiba and CotterVaszepa need an authorization for it. Please help, she was wondering if Tammy SoursGreg has a coupon for any of those meds.

## 2016-01-25 NOTE — Telephone Encounter (Signed)
LVM letting pt know.  

## 2016-01-25 NOTE — Telephone Encounter (Signed)
Pt called you back. She states she can pick the samples up tomorrow.  Can you please give her a call back

## 2016-01-26 ENCOUNTER — Other Ambulatory Visit: Payer: Self-pay | Admitting: Family

## 2016-01-26 MED ORDER — SAXAGLIPTIN HCL 2.5 MG PO TABS: 2.5000 mg | ORAL_TABLET | Freq: Every day | ORAL | Status: DC

## 2016-01-26 MED ORDER — BASAGLAR KWIKPEN 100 UNIT/ML ~~LOC~~ SOPN: 15.0000 [IU] | PEN_INJECTOR | Freq: Every day | SUBCUTANEOUS | Status: DC

## 2016-01-26 NOTE — Telephone Encounter (Signed)
Spoke with pt she is coming in around 4 to get the samples. I told her to let front know that she is here to [place] samples from [place] when she gets here.

## 2016-01-27 ENCOUNTER — Telehealth: Payer: Self-pay

## 2016-01-27 NOTE — Telephone Encounter (Signed)
PA initiated and approved via CoverMyMeds Key [id number].  Pt and pharmacy to be advised

## 2016-01-27 NOTE — Telephone Encounter (Signed)
Approved thru 01/25/2017

## 2016-02-03 ENCOUNTER — Encounter: Payer: Self-pay | Admitting: Family

## 2016-02-14 ENCOUNTER — Encounter: Payer: Self-pay | Admitting: Family

## 2016-03-16 ENCOUNTER — Encounter: Payer: Self-pay | Admitting: Family
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
## 2016-03-21 MED ORDER — LORCASERIN HCL ER 20 MG PO TB24: 20.0000 mg | ORAL_TABLET | Freq: Every day | ORAL | Status: DC

## 2016-03-22 ENCOUNTER — Other Ambulatory Visit: Payer: Self-pay

## 2016-03-23 ENCOUNTER — Telehealth: Payer: Self-pay | Admitting: Family

## 2016-03-23 NOTE — Telephone Encounter (Signed)
Pt called state insurance is not covering for BELVIQ XR, she was wondering if we have any discount card? Please call her back

## 2016-03-23 NOTE — Telephone Encounter (Signed)
Discount cards are available on the [employer] website but we should have cards if she would like to stop by.

## 2016-03-27 NOTE — Telephone Encounter (Signed)
Pt aware. Would like more than a 30 day supply next refill.

## 2016-03-31 ENCOUNTER — Encounter: Payer: Self-pay | Admitting: Family

## 2016-04-09 ENCOUNTER — Other Ambulatory Visit: Payer: Self-pay | Admitting: Family

## 2016-05-11 ENCOUNTER — Ambulatory Visit (INDEPENDENT_AMBULATORY_CARE_PROVIDER_SITE_OTHER): Payer: 59 | Admitting: Family

## 2016-05-11 ENCOUNTER — Encounter: Payer: Self-pay | Admitting: Family

## 2016-05-11 VITALS — BP 170/82 | HR 80 | Temp 98.1°F | Resp 16 | Ht 62.0 in | Wt 165.0 lb

## 2016-05-11 DIAGNOSIS — I10 Essential (primary) hypertension: Secondary | ICD-10-CM

## 2016-05-11 DIAGNOSIS — E119 Type 2 diabetes mellitus without complications: Secondary | ICD-10-CM | POA: Diagnosis not present

## 2016-05-11 LAB — POCT GLYCOSYLATED HEMOGLOBIN (HGB A1C): HEMOGLOBIN A1C: 9

## 2016-05-11 MED ORDER — JARDIANCE 10 MG PO TABS: 10.0000 mg | ORAL_TABLET | Freq: Every day | ORAL | Status: DC

## 2016-05-11 MED ORDER — LISINOPRIL 20 MG PO TABS: 20.0000 mg | ORAL_TABLET | Freq: Every day | ORAL | Status: DC

## 2016-05-11 NOTE — Assessment & Plan Note (Signed)
Type 2 diabetes with concern for patient compliance with current regimen. In office POCT A1c of 9.0. Increase physical are to 35 units. Start [place]Jardiance. Discussed importance of nutrition and physical activity to help maintain blood sugars. Maintained on lisinopril for CAD risk reduction. Declines Pneumovax. Diabetic foot exam completed today. Continue monitor blood sugars at home. Follow-up in 3 months or sooner.

## 2016-05-11 NOTE — Assessment & Plan Note (Signed)
Hypertension remains labile as patient has been less than compliant with medication regimen. Discussed importance of taking medications as prescribed to prevent future end organ damage. Encourage nutritional intake that is low in sodium and high in nutrient dense foods. Increase lisinopril to 20 mg. Encouraged to monitor blood pressures at home. Follow-up in one month or sooner.

## 2016-05-11 NOTE — Progress Notes (Signed)
Pre visit review using our clinic review tool, if applicable. No additional management support is needed unless otherwise documented below in the visit note. 

## 2016-05-11 NOTE — Patient Instructions (Signed)
Thank you for choosing [facility].  Summary/Instructions:  Please start the Jardiance.  Please increase your Basaglar to 35 units. If in 1 week blood sugars are not improve go up to 40 units.  Continue to take your medications as prescribed.  Your prescription(s) have been submitted to your pharmacy or been printed and provided for you. Please take as directed and contact our office if you believe you are having problem(s) with the medication(s) or have any questions.  If your symptoms worsen or fail to improve, please contact our office for further instruction, or in case of emergency go directly to the emergency room at the closest medical facility.

## 2016-05-11 NOTE — Progress Notes (Signed)
Subjective:    Patient ID: Deborah RummageRegina Wiggins, female    DOB: [birth year], 55 y.o.   MRN: [id number]  Chief Complaint  Patient presents with  . Follow-up    diabetes follow up, says that she checked her sugar today and it was 266    HPI:  Deborah Wiggins is a 55 y.o. female who  has a past medical history of Diabetes mellitus; Hyperlipidemia; Obesity; and Scoliosis (1977). and presents today for a follow up office visit.   1.) Type 2 diabetes - Currently prescribed Basaglar at 30 units. Reports taking the medication as prescribed and denies adverse side effects. Denies hypoglycemic readings. Believes her eyesight may be changing some. Blood sugar this morning was 266 but was not fasting.   Lab Results  Component Value Date   HGBA1C 9.0 05/11/2016   2.) Hypertension - Prescribed lisinopril and reports not taking the medication as prescribed. Blood pressure remains elevated above goal of 140/90. Reports that her diet is not very good.   BP Readings from Last 3 Encounters:  05/11/16 170/82  01/13/16 136/84  09/03/15 122/70    Allergies  Allergen Reactions  . Influenza Vaccines   . Prednisone     REACTION: angry     Current Outpatient Prescriptions on File Prior to Visit  Medication Sig Dispense Refill  . B Complex-Biotin-FA (VITAMIN B50 COMPLEX PO) Take by mouth.    . CHANTIX CONTINUING MONTH PAK 1 MG tablet TAKE ONE TABLET BY MOUTH TWICE DAILY 56 tablet 0  . ESTROGENS CONJUGATED PO Take by mouth every morning.    Marland Kitchen. glucose blood (ONE TOUCH ULTRA TEST) test strip 1 each by Other route 2 (two) times daily. Use to check blood sugars twice a day Dx E11.9 100 each 3  . Icosapent Ethyl (VASCEPA) 1 g CAPS Take 2 g by mouth 2 (two) times daily. 120 capsule 2  . Insulin Glargine (BASAGLAR KWIKPEN) 100 UNIT/ML SOPN Inject 0.15 mLs (15 Units total) into the skin at bedtime. 15 mL 1  . Insulin Pen Needle (B-D ULTRAFINE III SHORT PEN) 31G X 8 MM MISC Use 1 needle per injection. Use needle to  inject insulin 1-4 times daily as instructed. 90 each 0  . magnesium gluconate (MAGONATE) 500 MG tablet Take 500 mg by mouth 2 (two) times daily.    . ONE TOUCH LANCETS MISC 1 each by Does not apply route 2 (two) times daily.      Marland Kitchen. zolpidem (AMBIEN) 10 MG tablet Take 10 mg by mouth at bedtime.     No current facility-administered medications on file prior to visit.    Review of Systems  Eyes:       Denies changes in vision  Endocrine: Negative for polydipsia, polyphagia and polyuria.  Neurological: Negative for numbness.      Objective:    BP 170/82 mmHg  Pulse 80  Temp(Src) 98.1 F (36.7 C) (Oral)  Resp 16  Ht 5\' 2"  (1.575 m)  Wt 165 lb (74.844 kg)  BMI 30.17 kg/m2  SpO2 97% Nursing note and vital signs reviewed.  Physical Exam  Constitutional: She is oriented to person, place, and time. She appears well-developed and well-nourished. No distress.  Cardiovascular: Normal rate, regular rhythm, normal heart sounds and intact distal pulses.   Pulmonary/Chest: Effort normal and breath sounds normal.  Neurological: She is alert and oriented to person, place, and time.  Skin: Skin is warm and dry.  Psychiatric: Her behavior is normal. Judgment and thought content normal.  Her mood appears anxious.       Assessment & Plan:   Problem List Items Addressed This Visit      Cardiovascular and Mediastinum   Hypertension    Hypertension remains labile as patient has been less than compliant with medication regimen. Discussed importance of taking medications as prescribed to prevent future end organ damage. Encourage nutritional intake that is low in sodium and high in nutrient dense foods. Increase lisinopril to 20 mg. Encouraged to monitor blood pressures at home. Follow-up in one month or sooner.      Relevant Medications   lisinopril (PRINIVIL,ZESTRIL) 20 MG tablet     Endocrine   Type 2 diabetes mellitus (HCC) - Primary    Type 2 diabetes with concern for patient compliance  with current regimen. In office POCT A1c of 9.0. Increase physical are to 35 units. Start [place]Jardiance. Discussed importance of nutrition and physical activity to help maintain blood sugars. Maintained on lisinopril for CAD risk reduction. Declines Pneumovax. Diabetic foot exam completed today. Continue monitor blood sugars at home. Follow-up in 3 months or sooner.      Relevant Medications   JARDIANCE 10 MG TABS tablet   lisinopril (PRINIVIL,ZESTRIL) 20 MG tablet   Other Relevant Orders   POCT HgB A1C (Completed)       I have discontinued Ms. Tolen's sitaGLIPtin, saxagliptin HCl, Lorcaserin HCl ER, and TRESIBA FLEXTOUCH. I have also changed her lisinopril. Additionally, I am having her start on JARDIANCE. Lastly, I am having her maintain her ONE TOUCH LANCETS, B Complex-Biotin-FA (VITAMIN B50 COMPLEX PO), magnesium gluconate, ESTROGENS CONJUGATED PO, glucose blood, zolpidem, CHANTIX CONTINUING MONTH PAK, Insulin Pen Needle, Icosapent Ethyl, and BASAGLAR KWIKPEN.   Meds ordered this encounter  Medications  . JARDIANCE 10 MG TABS tablet    Sig: Take 10 mg by mouth daily.    Dispense:  30 tablet    Refill:  2    Order Specific Question:  Supervising Provider    Answer:  Hillard DankerRAWFORD, ELIZABETH A [[id number]]  . lisinopril (PRINIVIL,ZESTRIL) 20 MG tablet    Sig: Take 1 tablet (20 mg total) by mouth daily.    Dispense:  90 tablet    Refill:  1    Order Specific Question:  Supervising Provider    Answer:  Hillard DankerRAWFORD, ELIZABETH A [[id number]]     Follow-up: Return in about 1 month (around 06/10/2016).  Jeanine Luzalone, Gregory, FNP

## 2016-05-12 ENCOUNTER — Telehealth: Payer: Self-pay

## 2016-05-12 ENCOUNTER — Encounter: Payer: Self-pay | Admitting: Family

## 2016-05-12 NOTE — Telephone Encounter (Signed)
PA initiated and APPROVED via CoverMyMeds key [id number].  Pt advised via MyChart message

## 2016-05-15 ENCOUNTER — Encounter: Payer: Self-pay | Admitting: Family

## 2016-06-02 ENCOUNTER — Encounter: Payer: Self-pay | Admitting: Family

## 2016-06-09 ENCOUNTER — Other Ambulatory Visit: Payer: Self-pay | Admitting: Family

## 2016-06-22 ENCOUNTER — Encounter: Payer: Self-pay | Admitting: Family

## 2016-07-06 LAB — HM DIABETES EYE EXAM

## 2016-07-13 ENCOUNTER — Encounter: Payer: Self-pay | Admitting: Family

## 2016-09-11 ENCOUNTER — Other Ambulatory Visit: Payer: Self-pay | Admitting: Family

## 2016-11-23 ENCOUNTER — Encounter: Payer: Self-pay | Admitting: Family

## 2016-11-23 ENCOUNTER — Ambulatory Visit (INDEPENDENT_AMBULATORY_CARE_PROVIDER_SITE_OTHER): Payer: 59 | Admitting: Family

## 2016-11-23 ENCOUNTER — Ambulatory Visit (INDEPENDENT_AMBULATORY_CARE_PROVIDER_SITE_OTHER)
Admission: RE | Admit: 2016-11-23 | Discharge: 2016-11-23 | Disposition: A | Discharge status: Home / Self Care | Payer: 59 | Source: Ambulatory Visit | Attending: Family | Admitting: Family

## 2016-11-23 VITALS — BP 170/102 | HR 71 | Temp 97.7°F | Resp 16 | Ht 62.0 in | Wt 165.0 lb

## 2016-11-23 DIAGNOSIS — M549 Dorsalgia, unspecified: Secondary | ICD-10-CM | POA: Diagnosis not present

## 2016-11-23 DIAGNOSIS — I10 Essential (primary) hypertension: Secondary | ICD-10-CM | POA: Diagnosis not present

## 2016-11-23 NOTE — Assessment & Plan Note (Addendum)
This is a new problem. Question lipoma verus underlying inflammation. Unlikely malignancy but cannot be fully ruled out. Obtain x-rays to rule out structural abnormality. May require ultrasound or MRI. Treat conservatively with ice and compression to determine improvement. Follow up pending x-ray results or if symptoms worsen or do not improve.

## 2016-11-23 NOTE — Patient Instructions (Signed)
Thank you for choosing [facility].  SUMMARY AND INSTRUCTIONS:  Ice / with occasional moist heat x 20 minutes every 2 hours and as needed.   X-rays today. May require additional imaging.   Duexis - 3x daily for the next 3-5 days.    Medication:  Your prescription(s) have been submitted to your pharmacy or been printed and provided for you. Please take as directed and contact our office if you believe you are having problem(s) with the medication(s) or have any questions.   Follow up:  If your symptoms worsen or fail to improve, please contact our office for further instruction, or in case of emergency go directly to the emergency room at the closest medical facility.

## 2016-11-23 NOTE — Assessment & Plan Note (Signed)
Blood pressure elevated today as patient has not taken her medication. Emphasize importance of taking medication as prescribed to reduce risk of end organ damage in the future. Continue to monitor blood pressure at home and follow low-sodium diet.

## 2016-11-23 NOTE — Progress Notes (Signed)
Subjective:    Patient ID: Deborah Wiggins, female    DOB: [birth year], 56 y.o.   MRN: [id number]  Chief Complaint  Patient presents with  . Chest Pain    left side rib pain, has been going on x2 weeks, getting worse, does have some nausea    HPI:  Deborah Wiggins is a 56 y.o. female who  has a past medical history of Diabetes mellitus; Hyperlipidemia; Obesity; and Scoliosis (1977). and presents today for an c   acute office visit.   This is a new problem. Associated symptom of pain located on the left side rib pain that has been going on for about 2 weeks. Denies any trauma or injury. Pain is described as a constant dull pain that tends to be worse in the evening and worse with pressure. Modifying factors include a heating pad which does help. Concern that her symptoms are getting wider and spreading a little. Denies any urinary frequency, urgency, dysuria, or hematuria. Does not really hurt with cough or deep breathing.  Allergies  Allergen Reactions  . Influenza Vaccines   . Prednisone     REACTION: angry      Outpatient Medications Prior to Visit  Medication Sig Dispense Refill  . B Complex-Biotin-FA (VITAMIN B50 COMPLEX PO) Take by mouth.    . ESTROGENS CONJUGATED PO Take by mouth every morning.    Marland Kitchen. glucose blood (ONE TOUCH ULTRA TEST) test strip 1 each by Other route 2 (two) times daily. Use to check blood sugars twice a day Dx E11.9 100 each 3  . Insulin Glargine (BASAGLAR KWIKPEN) 100 UNIT/ML SOPN INJECT 15 UNITS SUBCUTANEOUSLY AT BEDTIME 3 mL 1  . Insulin Pen Needle (B-D ULTRAFINE III SHORT PEN) 31G X 8 MM MISC Use 1 needle per injection. Use needle to inject insulin 1-4 times daily as instructed. 90 each 0  . lisinopril (PRINIVIL,ZESTRIL) 20 MG tablet Take 1 tablet (20 mg total) by mouth daily. 90 tablet 1  . magnesium gluconate (MAGONATE) 500 MG tablet Take 500 mg by mouth 2 (two) times daily.    . ONE TOUCH LANCETS MISC 1 each by Does not apply route 2 (two) times daily.       Marland Kitchen. zolpidem (AMBIEN) 10 MG tablet Take 10 mg by mouth at bedtime.    . CHANTIX CONTINUING MONTH PAK 1 MG tablet TAKE ONE TABLET BY MOUTH TWICE DAILY 56 tablet 0  . Icosapent Ethyl (VASCEPA) 1 g CAPS Take 2 g by mouth 2 (two) times daily. 120 capsule 2  . JARDIANCE 10 MG TABS tablet Take 10 mg by mouth daily. 30 tablet 2   No facility-administered medications prior to visit.       Past Surgical History:  Procedure Laterality Date  . CHOLECYSTECTOMY        Past Medical History:  Diagnosis Date  . Diabetes mellitus    Type II  . Hyperlipidemia    Mixed  . Obesity   . Scoliosis 1977   remote harrington rod surgery      Review of Systems  Constitutional: Negative for chills and fever.  Respiratory: Negative for chest tightness and shortness of breath.   Cardiovascular: Negative for chest pain, palpitations and leg swelling.  Gastrointestinal: Negative for abdominal pain.  Genitourinary: Positive for flank pain. Negative for difficulty urinating, dysuria, frequency, hematuria, menstrual problem and urgency.  Musculoskeletal: Negative for back pain.      Objective:    BP (!) 170/102 (BP Location: Left Arm, Patient  Position: Sitting, Cuff Size: Normal)   Pulse 71   Temp 97.7 F (36.5 C) (Oral)   Resp 16   Ht 5\' 2"  (1.575 m)   Wt 165 lb (74.8 kg)   SpO2 96%   BMI 30.18 kg/m  Nursing note and vital signs reviewed.  Physical Exam  Constitutional: She is oriented to person, place, and time. She appears well-developed and well-nourished. No distress.  Cardiovascular: Normal rate, regular rhythm, normal heart sounds and intact distal pulses.   Pulmonary/Chest: Effort normal and breath sounds normal.  Abdominal: Normal appearance and bowel sounds are normal. She exhibits no mass. There is no hepatosplenomegaly. There is no tenderness. There is no rigidity, no guarding, no CVA tenderness, no tenderness at McBurney's point and negative Murphy's sign.  Left flank - There is  no obvious deformity or discoloration with mild/moderate edema. No CVA tenderness, crepitus or deformity. Small questionable mass of the left side.  Neurological: She is alert and oriented to person, place, and time.  Skin: Skin is warm and dry.  Psychiatric: She has a normal mood and affect. Her behavior is normal. Judgment and thought content normal.       Assessment & Plan:   Problem List Items Addressed This Visit      Cardiovascular and Mediastinum   Hypertension    Blood pressure elevated today as patient has not taken her medication. Emphasize importance of taking medication as prescribed to reduce risk of end organ damage in the future. Continue to monitor blood pressure at home and follow low-sodium diet.        Other   Acute left-sided back pain - Primary    This is a new problem. Question lipoma verus underlying inflammation. Unlikely malignancy but cannot be fully ruled out. Obtain x-rays to rule out structural abnormality. May require ultrasound or MRI. Treat conservatively with ice and compression to determine improvement. Follow up pending x-ray results or if symptoms worsen or do not improve.       Relevant Orders   DG Lumbar Spine Complete (Completed)       I have discontinued Ms. Heppler's CHANTIX CONTINUING MONTH PAK, Icosapent Ethyl, and JARDIANCE. I am also having her maintain her ONE TOUCH LANCETS, B Complex-Biotin-FA (VITAMIN B50 COMPLEX PO), magnesium gluconate, ESTROGENS CONJUGATED PO, glucose blood, zolpidem, Insulin Pen Needle, lisinopril, and BASAGLAR KWIKPEN.   Follow-up: Return in about 3 weeks (around 12/14/2016).  Jeanine Luz, FNP

## 2016-12-18 ENCOUNTER — Other Ambulatory Visit: Payer: Self-pay | Admitting: Family

## 2017-03-26 ENCOUNTER — Other Ambulatory Visit: Payer: Self-pay | Admitting: *Deleted

## 2017-03-26 MED ORDER — BASAGLAR KWIKPEN 100 UNIT/ML ~~LOC~~ SOPN: PEN_INJECTOR | SUBCUTANEOUS | 0 refills | Status: DC

## 2017-03-26 NOTE — Telephone Encounter (Signed)
Pt left msg on triage requesting refill on her insulin. Called pt no answer LMOM annual appt is due in June will send 30 day script until annual appt...Raechel Chute/lmb

## 2017-05-10 ENCOUNTER — Other Ambulatory Visit: Payer: Self-pay | Admitting: Family

## 2017-05-10 ENCOUNTER — Encounter: Payer: Self-pay | Admitting: Family

## 2017-06-22 ENCOUNTER — Other Ambulatory Visit: Payer: Self-pay | Admitting: Family

## 2017-06-25 ENCOUNTER — Other Ambulatory Visit: Payer: Self-pay | Admitting: Family

## 2017-06-26 ENCOUNTER — Other Ambulatory Visit: Payer: Self-pay | Admitting: Family

## 2017-08-17 ENCOUNTER — Other Ambulatory Visit (INDEPENDENT_AMBULATORY_CARE_PROVIDER_SITE_OTHER): Payer: 59

## 2017-08-17 ENCOUNTER — Encounter: Payer: Self-pay | Admitting: Family

## 2017-08-17 ENCOUNTER — Ambulatory Visit (INDEPENDENT_AMBULATORY_CARE_PROVIDER_SITE_OTHER): Payer: 59 | Admitting: Family

## 2017-08-17 VITALS — BP 168/94 | HR 74 | Temp 98.2°F | Resp 16 | Ht 62.0 in | Wt 162.4 lb

## 2017-08-17 DIAGNOSIS — E119 Type 2 diabetes mellitus without complications: Secondary | ICD-10-CM

## 2017-08-17 DIAGNOSIS — I10 Essential (primary) hypertension: Secondary | ICD-10-CM | POA: Diagnosis not present

## 2017-08-17 DIAGNOSIS — Z794 Long term (current) use of insulin: Secondary | ICD-10-CM | POA: Diagnosis not present

## 2017-08-17 DIAGNOSIS — M5431 Sciatica, right side: Secondary | ICD-10-CM

## 2017-08-17 DIAGNOSIS — M5430 Sciatica, unspecified side: Secondary | ICD-10-CM | POA: Insufficient documentation

## 2017-08-17 LAB — COMPREHENSIVE METABOLIC PANEL
ALK PHOS: 86 U/L (ref 39–117)
ALT: 20 U/L (ref 0–35)
AST: 21 U/L (ref 0–37)
Albumin: 4.1 g/dL (ref 3.5–5.2)
BILIRUBIN TOTAL: 0.7 mg/dL (ref 0.2–1.2)
BUN: 13 mg/dL (ref 6–23)
CO2: 26 mEq/L (ref 19–32)
CREATININE: 0.72 mg/dL (ref 0.40–1.20)
Calcium: 9.2 mg/dL (ref 8.4–10.5)
Chloride: 98 mEq/L (ref 96–112)
GFR: 88.38 mL/min (ref >60.00)
GLUCOSE: 296 mg/dL — AB (ref 70–99)
Potassium: 4.5 mEq/L (ref 3.5–5.1)
Sodium: 132 mEq/L — ABNORMAL LOW (ref 135–145)
TOTAL PROTEIN: 6.7 g/dL (ref 6.0–8.3)

## 2017-08-17 LAB — HM DIABETES EYE EXAM

## 2017-08-17 LAB — HEMOGLOBIN A1C: Hgb A1c MFr Bld: 9.8 % — ABNORMAL HIGH (ref 4.6–6.5)

## 2017-08-17 MED ORDER — BASAGLAR KWIKPEN 100 UNIT/ML ~~LOC~~ SOPN: 36.0000 [IU] | PEN_INJECTOR | Freq: Every day | SUBCUTANEOUS | 2 refills | Status: DC

## 2017-08-17 MED ORDER — MELOXICAM 15 MG PO TABS: 15.0000 mg | ORAL_TABLET | Freq: Every day | ORAL | 0 refills | Status: DC

## 2017-08-17 MED ORDER — TRIAMCINOLONE ACETONIDE 0.1 % EX CREA: 1.0000 "application " | TOPICAL_CREAM | Freq: Two times a day (BID) | CUTANEOUS | 0 refills | Status: DC

## 2017-08-17 MED ORDER — LISINOPRIL 20 MG PO TABS: 20.0000 mg | ORAL_TABLET | Freq: Every day | ORAL | 1 refills | Status: DC

## 2017-08-17 NOTE — Patient Instructions (Signed)
Thank you for choosing [facility].  SUMMARY AND INSTRUCTIONS:  Please start taking the Invokana.  We will check your A1c today.  Ice x 20 minutes every other hour and as needed following activity.  Stretches and exercises throughout the day.  Icy hot / Biofreeze / Solanpas as needed for symptom relief.   Follow up for symptom worsening.   Continue to monitor your blood sugars at home.   Medication:  Your prescription(s) have been submitted to your pharmacy or been printed and provided for you. Please take as directed and contact our office if you believe you are having problem(s) with the medication(s) or have any questions.  Labs:  Please stop by the lab on the lower level of the building for your blood work. Your results will be released to MyChart (or called to you) after review, usually within 72 hours after test completion. If any changes need to be made, you will be notified at that same time.  1.) The lab is open from 7:30am to 5:30 pm Monday-Friday 2.) No appointment is necessary 3.) Fasting (if needed) is 6-8 hours after food and drink; black coffee and water are okay   Follow up:  If your symptoms worsen or fail to improve, please contact our office for further instruction, or in case of emergency go directly to the emergency room at the closest medical facility.

## 2017-08-17 NOTE — Progress Notes (Signed)
Subjective:    Patient ID: Deborah Wiggins, female    DOB: [birth year], 56 y.o.   MRN: [id number]  Chief Complaint  Patient presents with  . Back Pain    lower back pain goes down right leg    HPI:  Deborah Wiggins is a 56 y.o. female who  has a past medical history of Diabetes mellitus; Hyperlipidemia; Obesity; and Scoliosis (1977). and presents today for a follow up office visit.  1.) Low back pain - This is a new problem. Associated symptom of pain located in her lower back and posterior hip that has been going on for about 1 week. No trauma or injury. Pain is described and as constant and sharp at times and achy with movement.  Modifying factors include Aleve and heating pads which did not help very much. Course of the symptoms have stayed about the same. Denies changes to bowel or saddle anesthesia. No fevers, chills, or weight loss.   2.) Diabetes - Currently prescribed Basaglar (36 units daily). Reports taking the medication as prescribed and denies adverse side effects or hypoglycemic readings. Blood sugars at home have remained high. Denies new symptoms of end organ damage. No excessive hunger, thirst, or urination. Working on a low/carbohydrate modified oral intake.  Lab Results  Component Value Date   HGBA1C 9.8 (H) 08/17/2017     Lab Results  Component Value Date   CREATININE 0.72 08/17/2017   BUN 13 08/17/2017   NA 132 (L) 08/17/2017   K 4.5 08/17/2017   CL 98 08/17/2017   CO2 26 08/17/2017       Allergies  Allergen Reactions  . Influenza Vaccines   . Prednisone     REACTION: angry      Outpatient Medications Prior to Visit  Medication Sig Dispense Refill  . B Complex-Biotin-FA (VITAMIN B50 COMPLEX PO) Take by mouth.    Marland Kitchen glucose blood (ONE TOUCH ULTRA TEST) test strip 1 each by Other route 2 (two) times daily. Use to check blood sugars twice a day Dx E11.9 100 each 3  . Insulin Pen Needle (B-D ULTRAFINE III SHORT PEN) 31G X 8 MM MISC Use 1 needle per  injection. Use needle to inject insulin 1-4 times daily as instructed. 90 each 0  . magnesium gluconate (MAGONATE) 500 MG tablet Take 500 mg by mouth 2 (two) times daily.    . ONE TOUCH LANCETS MISC 1 each by Does not apply route 2 (two) times daily.      Marland Kitchen zolpidem (AMBIEN) 10 MG tablet Take 10 mg by mouth at bedtime.    . Insulin Glargine (BASAGLAR KWIKPEN) 100 UNIT/ML SOPN INJECT 15 UNITS SUBCUTANEOUSLY AT BEDTIME .  NEED  OFFICE  VISIT  FOR  FUTURE  REFILLS 15 mL 0  . lisinopril (PRINIVIL,ZESTRIL) 20 MG tablet Take 1 tablet (20 mg total) by mouth daily. 90 tablet 1  . ESTROGENS CONJUGATED PO Take by mouth every morning.     No facility-administered medications prior to visit.       Past Surgical History:  Procedure Laterality Date  . CHOLECYSTECTOMY        Past Medical History:  Diagnosis Date  . Diabetes mellitus    Type II  . Hyperlipidemia    Mixed  . Obesity   . Scoliosis 1977   remote harrington rod surgery      Review of Systems  Constitutional: Negative for chills, fatigue and fever.  Eyes:       Denies changes in  vision  Respiratory: Negative for chest tightness and shortness of breath.   Cardiovascular: Negative for chest pain, palpitations and leg swelling.  Endocrine: Negative for polydipsia, polyphagia and polyuria.  Musculoskeletal: Positive for back pain.  Neurological: Negative for numbness.      Objective:    BP (!) 168/94 (BP Location: Left Arm, Patient Position: Sitting, Cuff Size: Large)   Pulse 74   Temp 98.2 F (36.8 C) (Oral)   Resp 16   Ht _0  (1.575 m)   Wt 162 lb 6.4 oz (73.7 kg)   SpO2 97%   BMI 29.70 kg/m  Nursing note and vital signs reviewed.  Physical Exam  Constitutional: She is oriented to person, place, and time. She appears well-developed and well-nourished. No distress.  Cardiovascular: Normal rate, regular rhythm, normal heart sounds and intact distal pulses.  Exam reveals no gallop and no friction rub.   No murmur  heard. Pulmonary/Chest: Effort normal and breath sounds normal. No respiratory distress. She has no wheezes. She has no rales. She exhibits no tenderness.  Musculoskeletal:  Low back - no obvious deformity, discoloration, or edema. Palpable tenderness without crepitus or deformity noted in the right lumbar paraspinal musculature and sciatic nerve distribution. Range of motion limited secondary to discomfort in flexion and lateral bending. Distal pulses and sensation are intact and appropriate. Negative straight leg raise and positive Faber's.  Neurological: She is alert and oriented to person, place, and time.  Skin: Skin is warm and dry.  Psychiatric: She has a normal mood and affect. Her behavior is normal. Judgment and thought content normal.       Assessment & Plan:   Problem List Items Addressed This Visit      Cardiovascular and Mediastinum   Hypertension    Blood pressure remains poorly controlled with less than optimal patient compliance with medication regimen based on previous refills. Encouraged to take medications as prescribed to reduce risk of end organ damage in the future. Continue to monitor blood pressure at home and follow low-sodium diet. Refill lisinopril. Follow-up in one month to check blood pressure.      Relevant Medications   lisinopril (PRINIVIL,ZESTRIL) 20 MG tablet     Endocrine   Type 2 diabetes mellitus ([place]) - Primary    Type 2 diabetes poorly controlled with current medication regimen and likely poor diet. Obtain A1c and CMET. Continue current dosage of Basaglar pending A1c results. Encouraged to monitor blood sugars at home at least 1-2 times daily and follow a low/modified carbohydrate intake. Diabetic foot exam completed today. Maintained on lisinopril for CAD risk reduction. Encouraged to complete diabetic eye exam independently. Sample of [place] provided. Follow up in 3 months or sooner if needed.       Relevant Medications   Insulin Glargine  (BASAGLAR KWIKPEN) 100 UNIT/ML SOPN   lisinopril (PRINIVIL,ZESTRIL) 20 MG tablet   Other Relevant Orders   Comp Met (CMET) (Completed)   Hemoglobin A1c (Completed)     Nervous and Auditory   Sciatica    New onset symptoms consistent with sciatica most likely related to muscle imbalance. Treat conservatively with ice/moist heat and home exercise therapy. Start meloxicam for inflammation. Not a candidate for prednisone given poorly controlled blood sugars. Consider imaging or physical therapy if symptoms worsen or do not improve.           I have discontinued Ms. Pressnell's ESTROGENS CONJUGATED PO. I have also changed her [facility]. Additionally, I am having her start on meloxicam  and triamcinolone cream. Lastly, I am having her maintain her ONE TOUCH LANCETS, B Complex-Biotin-FA (VITAMIN B50 COMPLEX PO), magnesium gluconate, glucose blood, zolpidem, Insulin Pen Needle, and lisinopril.   Meds ordered this encounter  Medications  . Insulin Glargine (BASAGLAR KWIKPEN) 100 UNIT/ML SOPN    Sig: Inject 0.36 mLs (36 Units total) into the skin daily.    Dispense:  15 mL    Refill:  2    Please consider 90 day supplies to promote better adherence    Order Specific Question:   Supervising Provider    Answer:   Pricilla Holm A [[id number]]  . meloxicam (MOBIC) 15 MG tablet    Sig: Take 1 tablet (15 mg total) by mouth daily.    Dispense:  30 tablet    Refill:  0    Order Specific Question:   Supervising Provider    Answer:   Pricilla Holm A [[id number]]  . triamcinolone cream (KENALOG) 0.1 %    Sig: Apply 1 application topically 2 (two) times daily.    Dispense:  30 g    Refill:  0    Order Specific Question:   Supervising Provider    Answer:   Pricilla Holm A [[id number]]  . lisinopril (PRINIVIL,ZESTRIL) 20 MG tablet    Sig: Take 1 tablet (20 mg total) by mouth daily.    Dispense:  90 tablet    Refill:  1    Order Specific Question:   Supervising Provider    Answer:   Pricilla Holm A [[id number]]     Follow-up: Return in about 3 months (around 11/16/2017).  Mauricio Po, FNP

## 2017-08-17 NOTE — Assessment & Plan Note (Signed)
Type 2 diabetes poorly controlled with current medication regimen and likely poor diet. Obtain A1c and CMET. Continue current dosage of Basaglar pending A1c results. Encouraged to monitor blood sugars at home at least 1-2 times daily and follow a low/modified carbohydrate intake. Diabetic foot exam completed today. Maintained on lisinopril for CAD risk reduction. Encouraged to complete diabetic eye exam independently. Sample of [place] provided. Follow up in 3 months or sooner if needed.

## 2017-08-17 NOTE — Assessment & Plan Note (Signed)
Blood pressure remains poorly controlled with less than optimal patient compliance with medication regimen based on previous refills. Encouraged to take medications as prescribed to reduce risk of end organ damage in the future. Continue to monitor blood pressure at home and follow low-sodium diet. Refill lisinopril. Follow-up in one month to check blood pressure.

## 2017-08-17 NOTE — Assessment & Plan Note (Signed)
New onset symptoms consistent with sciatica most likely related to muscle imbalance. Treat conservatively with ice/moist heat and home exercise therapy. Start meloxicam for inflammation. Not a candidate for prednisone given poorly controlled blood sugars. Consider imaging or physical therapy if symptoms worsen or do not improve.

## 2017-08-20 ENCOUNTER — Encounter: Payer: Self-pay | Admitting: Family

## 2017-08-21 ENCOUNTER — Telehealth: Payer: Self-pay

## 2017-08-21 NOTE — Telephone Encounter (Signed)
PA for [employer] initiated. Waiting on response.   Key: [id number]

## 2017-08-28 ENCOUNTER — Encounter: Payer: Self-pay | Admitting: Family

## 2017-08-31 MED ORDER — SEMAGLUTIDE(0.25 OR 0.5MG/DOS) 2 MG/1.5ML ~~LOC~~ SOPN: 0.2500 mg | PEN_INJECTOR | Freq: Every day | SUBCUTANEOUS | 2 refills | Status: DC

## 2017-08-31 NOTE — Addendum Note (Signed)
Addended by: Jeanine Luz D on: 08/31/2017 04:49 PM   Modules accepted: Orders

## 2017-09-04 ENCOUNTER — Telehealth: Payer: Self-pay | Admitting: *Deleted

## 2017-09-04 MED ORDER — SEMAGLUTIDE(0.25 OR 0.5MG/DOS) 2 MG/1.5ML ~~LOC~~ SOPN: 0.2500 mg | PEN_INJECTOR | SUBCUTANEOUS | 2 refills | Status: DC

## 2017-09-04 NOTE — Telephone Encounter (Signed)
Should be weekly.

## 2017-09-04 NOTE — Telephone Encounter (Signed)
Rec'd call from pharmacist stating received script for Ozempic 0.25-0.5 mg stating to take daily, but this medication is meant to take 1 injection weekly. Wanting to know is this correct or ok to change to weekly. Deborah Wiggins is no longer here pls advise on msg,,,/lmb

## 2017-09-04 NOTE — Telephone Encounter (Signed)
Updated script w/correct directions sent electronically to [facility]...Raechel Chute

## 2017-09-11 NOTE — Telephone Encounter (Signed)
PA has been done for Ozempic and has been approved until 09/10/18.

## 2017-09-28 ENCOUNTER — Encounter: Payer: Self-pay | Admitting: Family

## 2017-10-01 ENCOUNTER — Ambulatory Visit: Payer: Self-pay | Admitting: Nurse Practitioner

## 2017-10-03 ENCOUNTER — Telehealth: Payer: Self-pay | Admitting: Nurse Practitioner

## 2017-10-03 MED ORDER — SEMAGLUTIDE(0.25 OR 0.5MG/DOS) 2 MG/1.5ML ~~LOC~~ SOPN: 0.2500 mg | PEN_INJECTOR | SUBCUTANEOUS | 0 refills | Status: DC

## 2017-10-03 NOTE — Telephone Encounter (Signed)
Patient requesting refill on ozembic to be sent to where last script was sent.  Patient is scheduled in December.

## 2017-10-03 NOTE — Telephone Encounter (Signed)
Rx has been sent to pharmacy. Pt is aware. 

## 2017-10-10 ENCOUNTER — Other Ambulatory Visit: Payer: Self-pay

## 2017-10-10 MED ORDER — SEMAGLUTIDE(0.25 OR 0.5MG/DOS) 2 MG/1.5ML ~~LOC~~ SOPN: 0.2500 mg | PEN_INJECTOR | SUBCUTANEOUS | 0 refills | Status: DC

## 2017-10-10 MED ORDER — BASAGLAR KWIKPEN 100 UNIT/ML ~~LOC~~ SOPN: 36.0000 [IU] | PEN_INJECTOR | Freq: Every day | SUBCUTANEOUS | 0 refills | Status: DC

## 2017-10-17 ENCOUNTER — Ambulatory Visit: Payer: Self-pay | Admitting: Family

## 2017-10-19 ENCOUNTER — Ambulatory Visit: Payer: Self-pay | Admitting: Family

## 2017-10-19 NOTE — Telephone Encounter (Signed)
Patient scheduled for January  

## 2017-11-06 ENCOUNTER — Ambulatory Visit: Payer: Self-pay | Admitting: Nurse Practitioner

## 2017-11-30 ENCOUNTER — Telehealth: Payer: Self-pay

## 2017-11-30 NOTE — Telephone Encounter (Signed)
PA for Ozempic has been approved from 09/10/17-09/10/2018.

## 2017-12-07 ENCOUNTER — Encounter: Payer: Self-pay | Admitting: Nurse Practitioner

## 2017-12-07 ENCOUNTER — Other Ambulatory Visit (INDEPENDENT_AMBULATORY_CARE_PROVIDER_SITE_OTHER): Payer: 59

## 2017-12-07 ENCOUNTER — Ambulatory Visit (INDEPENDENT_AMBULATORY_CARE_PROVIDER_SITE_OTHER): Payer: 59 | Admitting: Nurse Practitioner

## 2017-12-07 VITALS — BP 128/72 | HR 74 | Temp 98.0°F | Resp 16 | Ht 62.0 in | Wt 156.0 lb

## 2017-12-07 DIAGNOSIS — E119 Type 2 diabetes mellitus without complications: Secondary | ICD-10-CM

## 2017-12-07 DIAGNOSIS — Z794 Long term (current) use of insulin: Secondary | ICD-10-CM | POA: Diagnosis not present

## 2017-12-07 DIAGNOSIS — I499 Cardiac arrhythmia, unspecified: Secondary | ICD-10-CM

## 2017-12-07 DIAGNOSIS — I10 Essential (primary) hypertension: Secondary | ICD-10-CM | POA: Diagnosis not present

## 2017-12-07 LAB — COMPREHENSIVE METABOLIC PANEL
ALBUMIN: 4.5 g/dL (ref 3.5–5.2)
ALK PHOS: 86 U/L (ref 39–117)
ALT: 29 U/L (ref 0–35)
AST: 27 U/L (ref 0–37)
BUN: 11 mg/dL (ref 6–23)
CALCIUM: 9.8 mg/dL (ref 8.4–10.5)
CO2: 31 mEq/L (ref 19–32)
CREATININE: 0.71 mg/dL (ref 0.40–1.20)
Chloride: 100 mEq/L (ref 96–112)
GFR: 89.72 mL/min (ref >60.00)
Glucose, Bld: 180 mg/dL — ABNORMAL HIGH (ref 70–99)
Potassium: 4.2 mEq/L (ref 3.5–5.1)
Sodium: 137 mEq/L (ref 135–145)
TOTAL PROTEIN: 7.1 g/dL (ref 6.0–8.3)
Total Bilirubin: 0.6 mg/dL (ref 0.2–1.2)

## 2017-12-07 LAB — TSH: TSH: 0.89 u[IU]/mL (ref 0.35–4.50)

## 2017-12-07 LAB — HEMOGLOBIN A1C: HEMOGLOBIN A1C: 8.7 % — AB (ref 4.6–6.5)

## 2017-12-07 LAB — CBC
HEMATOCRIT: 47.1 % — AB (ref 36.0–46.0)
Hemoglobin: 16.2 g/dL — ABNORMAL HIGH (ref 12.0–15.0)
MCHC: 34.5 g/dL (ref 30.0–36.0)
MCV: 91.3 fl (ref 78.0–100.0)
PLATELETS: 177 10*3/uL (ref 150.0–400.0)
RBC: 5.16 Mil/uL — AB (ref 3.87–5.11)
RDW: 13.5 % (ref 11.5–15.5)
WBC: 9 10*3/uL (ref 4.0–10.5)

## 2017-12-07 NOTE — Patient Instructions (Addendum)
Please head downstairs for lab work.  We will decide what to do with your diabetes medication when we get your A1c back.  If you experience any confusion, passing out, chest pain, or shortness of breath please call 911 right away.  Id like to see you back in about 1 month, or sooner if you need me.  Thanks for letting me take care of you today :)

## 2017-12-07 NOTE — Progress Notes (Signed)
Name: Deborah Wiggins   MRN: [id number]    DOB: 1961/06/09   Date:12/08/2017       Progress Note  Subjective  Chief Complaint  Chief Complaint  Patient presents with  . Establish Care    Diabetes    HPI   Diabetes- maintained on basaglar 0.25 mls daily. Reports daily medication compliance without adverse medication effects. She was supposed to be starting on ozempic but she has not heard back yet  She does not routinely check her blood sugars. Reports polydipsia. Denies tremor, diaphoresis, polyuria, polyphagia. She does not want to take metformin due to leg cramps in the past.  Lab Results  Component Value Date   HGBA1C 8.7 (H) 12/07/2017   Hypertension -maintained on lisinopril. Reports daily medication compliance without adverse medication effects. Reports she does not check her blood pressure routinely. Denies headaches, vision changes, chest pain, shortness of breath, edema.  BP Readings from Last 3 Encounters:  12/07/17 128/72  08/17/17 (!) 168/94  11/23/16 (!) 170/102   Patient Active Problem List   Diagnosis Date Noted  . Sciatica 08/17/2017  . Acute left-sided back pain 11/23/2016  . Blood tests for routine general physical examination 09/03/2015  . Routine general medical examination at a health care facility 02/05/2015  . Hypertension   . Personal history of noncompliance with medical treatment 10/31/2012  . Hyperlipidemia 09/23/2010  . GERD 08/05/2010  . Type 2 diabetes mellitus ([facility]) 06/30/2010  . TOBACCO USE, QUIT 04/15/2010  . OBESITY 07/25/2009  . SCOLIOSIS 07/25/2009    Past Surgical History:  Procedure Laterality Date  . CHOLECYSTECTOMY      Family History  Problem Relation Age of Onset  . Dementia Mother        mild-mod  . Heart disease Father        CAD    Social History   Socioeconomic History  . Marital status: Divorced    Spouse name: Not on file  . Number of children: 2  . Years of education: 52  . Highest education level:  Not on file  Social Needs  . Financial resource strain: Not on file  . Food insecurity - worry: Not on file  . Food insecurity - inability: Not on file  . Transportation needs - medical: Not on file  . Transportation needs - non-medical: Not on file  Occupational History  . Occupation: [employer]  . Smoking status: Current Every Day Smoker    Packs/day: 1.00    Types: Cigarettes    Last attempt to quit: 06/20/2009    Years since quitting: 8.4  . Smokeless tobacco: Never Used  . Tobacco comment: Regular Exercise - No  Substance and Sexual Activity  . Alcohol use: Yes    Comment: Occasional  . Drug use: No  . Sexual activity: Not on file  Other Topics Concern  . Not on file  Social History Narrative   Fun: Gardening, playing cards   Denies religious beliefs that would effect health care.    Feels safe at home and denies abuse     Current Outpatient Medications:  .  B Complex-Biotin-FA (VITAMIN B50 COMPLEX PO), Take by mouth., Disp: , Rfl:  .  glucose blood (ONE TOUCH ULTRA TEST) test strip, 1 each by Other route 2 (two) times daily. Use to check blood sugars twice a day Dx E11.9, Disp: 100 each, Rfl: 3 .  Insulin Glargine (BASAGLAR KWIKPEN) 100 UNIT/ML SOPN, Inject 0.36 mLs (36 Units total) into the  skin daily., Disp: 11 mL, Rfl: 0 .  Insulin Pen Needle (B-D ULTRAFINE III SHORT PEN) 31G X 8 MM MISC, Use 1 needle per injection. Use needle to inject insulin 1-4 times daily as instructed., Disp: 90 each, Rfl: 0 .  lisinopril (PRINIVIL,ZESTRIL) 20 MG tablet, Take 1 tablet (20 mg total) by mouth daily., Disp: 90 tablet, Rfl: 1 .  magnesium gluconate (MAGONATE) 500 MG tablet, Take 500 mg by mouth 2 (two) times daily., Disp: , Rfl:  .  meloxicam (MOBIC) 15 MG tablet, Take 1 tablet (15 mg total) by mouth daily., Disp: 30 tablet, Rfl: 0 .  ONE TOUCH LANCETS MISC, 1 each by Does not apply route 2 (two) times daily.  , Disp: , Rfl:  .  Semaglutide (OZEMPIC) 0.25 or 0.5 MG/DOSE  SOPN, Inject 0.25 mg into the skin once a week., Disp: 1 mL, Rfl: 0 .  triamcinolone cream (KENALOG) 0.1 %, Apply 1 application topically 2 (two) times daily., Disp: 30 g, Rfl: 0 .  zolpidem (AMBIEN) 10 MG tablet, Take 10 mg by mouth at bedtime., Disp: , Rfl:   Allergies  Allergen Reactions  . Influenza Vaccines   . Metformin And Related Other (See Comments)    Leg cramps   . Prednisone     REACTION: angry     ROS See HPI  Objective  Vitals:   12/07/17 1502  BP: 128/72  Pulse: 74  Resp: 16  Temp: 98 F (36.7 C)  TempSrc: Oral  SpO2: 98%  Weight: 156 lb (70.8 kg)  Height: 5\' 2"  (1.575 m)   Body mass index is 28.53 kg/m.  Physical Exam Constitutional: Patient appears well-developed and well-nourished. No distress.  HENT: Head: Normocephalic and atraumatic. Nose: Nose normal. Mouth/Throat: Oropharynx is clear and moist. No oropharyngeal exudate.  Eyes: Conjunctivae normal. No scleral icterus.  Neck: Normal range of motion. Neck supple. No thyromegaly present.  Cardiovascular: Normal rate, irregular rhythm and normal heart sounds. No BLE edema. Pulmonary/Chest: Effort normal. Diminished breath sounds bilaterally. No respiratory distress. Abdominal: Soft. No distension. There is no tenderness.  Musculoskeletal: Normal range of motion, no joint effusions. No gross deformities Neurological: She is alert and oriented to person, place, and time. Coordination, balance, strength, speech and gait are normal.  Skin: Skin is warm and dry. No rash noted. Ruborous skin to BLE. Psychiatric: Patient has a normal mood and affect. behavior is normal. Judgment and thought content normal.  Diabetic Foot Exam: Diabetic Foot Exam - Simple   Simple Foot Form Visual Inspection No deformities, no ulcerations, no other skin breakdown bilaterally:  Yes Sensation Testing Intact to touch and monofilament testing bilaterally:  Yes Pulse Check Posterior Tibialis and Dorsalis pulse intact  bilaterally:  Yes Comments Dry skin bilaterally    PHQ2/9: Depression screen [facility] 12/07/2017 09/03/2015 05/31/2015  Decreased Interest 0 0 0  Down, Depressed, Hopeless 0 0 0  PHQ - 2 Score 0 0 0   Fall Risk: Fall Risk  12/07/2017 05/31/2015  Falls in the past year? No No     Assessment & Plan RTC in 1 month for follow up of chronic conditions

## 2017-12-08 ENCOUNTER — Encounter: Payer: Self-pay | Admitting: Nurse Practitioner

## 2017-12-08 DIAGNOSIS — I499 Cardiac arrhythmia, unspecified: Secondary | ICD-10-CM | POA: Insufficient documentation

## 2017-12-08 NOTE — Assessment & Plan Note (Addendum)
Heart rate is irregular on physical exam today. She declines EKG today. We discussed risks of irregular heart beat including blood clot, stroke, death. We also discussed EKG process and I explained that is was a simple, painless test. She verbalizes understanding but repeatedly declined EKG today. She says she was told years ago that her heart was irregular but that "it was nothing to worry about." Most recent EKG in 2016 shows NSR with frequent PACs She denies any syncope, dizziness, palpitations, tachycardia. - Comprehensive metabolic panel; Future - CBC; Future - TSH; Future We discussed strict ER precautions and again she verbalizes understanding

## 2017-12-08 NOTE — Assessment & Plan Note (Signed)
Elevated A1c reading in September 2018. She was suppsed to start ozempic but says she never received the medication today. We will recheck A1c today and then adjust medications accordingly. Encouraged regular monitoring of blood sugar at home - Hemoglobin A1c; Future - Comprehensive metabolic panel; Future - HM DIABETES FOOT EXAM; Future RTC in 1 month for follow up

## 2017-12-08 NOTE — Assessment & Plan Note (Signed)
Stable, continue lisinopril - Comprehensive metabolic panel; Future

## 2017-12-12 ENCOUNTER — Other Ambulatory Visit: Payer: Self-pay | Admitting: Nurse Practitioner

## 2017-12-12 DIAGNOSIS — Z794 Long term (current) use of insulin: Principal | ICD-10-CM

## 2017-12-12 DIAGNOSIS — E119 Type 2 diabetes mellitus without complications: Secondary | ICD-10-CM

## 2017-12-12 MED ORDER — DULAGLUTIDE 0.75 MG/0.5ML ~~LOC~~ SOAJ: 0.7500 mg | SUBCUTANEOUS | 1 refills | Status: DC

## 2017-12-13 ENCOUNTER — Other Ambulatory Visit: Payer: Self-pay

## 2017-12-13 NOTE — Telephone Encounter (Signed)
Patient is asking if you would send in an rx for chantix. Please advise. I will verify pharmacy with pt before it is sent.

## 2017-12-14 ENCOUNTER — Other Ambulatory Visit: Payer: Self-pay

## 2017-12-14 DIAGNOSIS — E119 Type 2 diabetes mellitus without complications: Secondary | ICD-10-CM

## 2017-12-14 DIAGNOSIS — Z794 Long term (current) use of insulin: Principal | ICD-10-CM

## 2017-12-14 MED ORDER — VARENICLINE TARTRATE 0.5 MG X 11 & 1 MG X 42 PO MISC
ORAL | 0 refills | Status: DC
Start: 2017-12-14 — End: 2018-12-02

## 2017-12-14 MED ORDER — DULAGLUTIDE 0.75 MG/0.5ML ~~LOC~~ SOAJ: 0.7500 mg | SUBCUTANEOUS | 1 refills | Status: DC

## 2017-12-14 NOTE — Telephone Encounter (Signed)
Okay to send in Chantix prescription for her;  We will call in "starting pack" which she takes for the first month; let us know when she is close to finishing that pack and prescription for continuing pack can be sent for continued treatment for up to 6 months.

## 2017-12-17 ENCOUNTER — Telehealth: Payer: Self-pay

## 2017-12-17 DIAGNOSIS — Z87891 Personal history of nicotine dependence: Secondary | ICD-10-CM

## 2017-12-17 NOTE — Telephone Encounter (Signed)
Insurance will not cover chantix. Alternatives would be bupropion and nicotine polacrilex. Please advise.

## 2017-12-19 MED ORDER — BUPROPION HCL ER (XL) 150 MG PO TB24: 150.0000 mg | ORAL_TABLET | Freq: Every day | ORAL | 1 refills | Status: DC

## 2017-12-19 NOTE — Telephone Encounter (Signed)
Sent pt response below through mychart.

## 2017-12-19 NOTE — Telephone Encounter (Signed)
I have sent a prescription for wellbutrin 150mg  once daily in the morning to help quit smoking. She should pick a quit date within 2 weeks of starting the wellbutrin and stop smoking. I will see her back on 2/22 and see how she is doing

## 2018-01-09 ENCOUNTER — Telehealth: Payer: Self-pay | Admitting: Nurse Practitioner

## 2018-01-09 NOTE — Telephone Encounter (Signed)
error 

## 2018-01-11 ENCOUNTER — Ambulatory Visit: Payer: Self-pay | Admitting: Nurse Practitioner

## 2018-01-14 ENCOUNTER — Other Ambulatory Visit: Payer: Self-pay | Admitting: Family

## 2018-01-15 ENCOUNTER — Telehealth: Payer: Self-pay | Admitting: Nurse Practitioner

## 2018-01-15 NOTE — Telephone Encounter (Signed)
Message below has been sent to patient through mychart.

## 2018-01-15 NOTE — Telephone Encounter (Signed)
Ms. Deborah Wiggins, Are you still taking and able to afford your basaglar? We could try to adjust this medication to improve your blood sugars. Also, as I was reviewing your medications I do not see ASPIRIN listed. I would recommend that you start taking 81mg  of aspirin daily since you are diabetic, to prevent heart problems and stroke. Let me know if you are willing to increase basaglar and I will send the new dosage.

## 2018-01-16 ENCOUNTER — Telehealth: Payer: Self-pay | Admitting: Nurse Practitioner

## 2018-01-16 NOTE — Telephone Encounter (Signed)
Can you send another mychart message for me, again I tried to send this back to her as a reply and it doesn't look like its going through:  Your A1c is not too terribly high. It is 8.7, and we just need to get it below 7. So, we could try small increases in the basaglar to get you to this goal and you would only have to worry about one medication daily. I would also be willing to try sending the ozempic again if you want, but I thought it might be easier if you only had to manage one medication a day. What do you think?

## 2018-01-17 NOTE — Telephone Encounter (Signed)
Mychart message was sent.

## 2018-01-22 ENCOUNTER — Other Ambulatory Visit: Payer: Self-pay

## 2018-01-22 MED ORDER — SEMAGLUTIDE(0.25 OR 0.5MG/DOS) 2 MG/1.5ML ~~LOC~~ SOPN: 0.2500 mg | PEN_INJECTOR | SUBCUTANEOUS | 2 refills | Status: DC

## 2018-01-24 ENCOUNTER — Telehealth: Payer: Self-pay | Admitting: Nurse Practitioner

## 2018-01-24 MED ORDER — BASAGLAR KWIKPEN 100 UNIT/ML ~~LOC~~ SOPN: 40.0000 [IU] | PEN_INJECTOR | Freq: Every day | SUBCUTANEOUS | 1 refills | Status: DC

## 2018-01-24 NOTE — Telephone Encounter (Signed)
Lets have her increase her basaglar to 40 units once daily and let me know if her blood sugars are above 200 after this change.  Please have her schedule office visit in June for follow up of diabetes and A1c check and  Work on eating a low carb diet.

## 2018-01-24 NOTE — Addendum Note (Signed)
Addended by: Evaristo BurySHAMBLEY, Kathryn Linarez N on: 01/24/2018 07:20 PM   Modules accepted: Orders

## 2018-01-25 NOTE — Telephone Encounter (Signed)
Sent through mychart

## 2018-01-25 NOTE — Addendum Note (Signed)
Addended by: Mercer PodWRENN, Griffith Santilli E on: 01/25/2018 08:34 AM   Modules accepted: Orders

## 2018-01-30 ENCOUNTER — Telehealth: Payer: Self-pay | Admitting: Nurse Practitioner

## 2018-01-30 NOTE — Telephone Encounter (Signed)
error 

## 2018-03-12 ENCOUNTER — Other Ambulatory Visit: Payer: Self-pay | Admitting: Family

## 2018-10-08 ENCOUNTER — Telehealth: Payer: Self-pay | Admitting: Nurse Practitioner

## 2018-10-08 NOTE — Telephone Encounter (Signed)
Copied from CRM [phone]#[id number]. Topic: Quick Communication - See Telephone Encounter >> Oct 08, 2018  9:41 AM Windy KalataMichael, Kalanie Fewell L, NT wrote: CRM for notification. See Telephone encounter for: 10/08/18.  Lawson FiscalLori is calling from Cover My Meds requesting to speak with someone in the prior authorizations in regards to the patient is needing a renewal on Semaglutide (OZEMPIC) 0.25 or 0.5 MG/DOSE SOPN.   Cb# [phone]

## 2018-10-08 NOTE — Telephone Encounter (Signed)
I called number below and spoke to TonopahMario. He needs to know if PA for Ozempic should still be on file. I advised him per patient med list it appears Ozempic has been discontinued. See meds.

## 2018-10-29 ENCOUNTER — Other Ambulatory Visit: Payer: Self-pay | Admitting: Nurse Practitioner

## 2018-11-11 ENCOUNTER — Other Ambulatory Visit: Payer: Self-pay | Admitting: *Deleted

## 2018-11-11 NOTE — Telephone Encounter (Signed)
We need her to come in for office visit prior to any refills, she has not been seen since January. She can see laura or julie on this Thursday, and keep her 2 week follow up appointment with me

## 2018-11-11 NOTE — Telephone Encounter (Signed)
Pt is calling checking on the status of ozempic PA

## 2018-11-11 NOTE — Telephone Encounter (Signed)
Patient calling requesting refill on Ozempic. It looks like this has been d/c'd. Patient states she is out of all DM meds. Please advise.   Comments:  I have made an appointment with Morrie SheldonAshley on 12-02-2018 for 3:30, but I need some medication and requesting Ozempic. I 've been without anything for over 2 weeks.

## 2018-11-12 ENCOUNTER — Encounter: Payer: Self-pay | Admitting: Nurse Practitioner

## 2018-11-12 NOTE — Telephone Encounter (Signed)
Tried contacting pt, unable to LVM due to full mail box 

## 2018-11-12 NOTE — Telephone Encounter (Signed)
Spoke with pt, appt scheduled for Thursday with Vernona RiegerLaura.

## 2018-11-14 ENCOUNTER — Encounter: Payer: Self-pay | Admitting: Nurse Practitioner

## 2018-11-14 ENCOUNTER — Ambulatory Visit: Payer: 59 | Admitting: Family

## 2018-12-02 ENCOUNTER — Encounter: Payer: Self-pay | Admitting: Nurse Practitioner

## 2018-12-02 ENCOUNTER — Ambulatory Visit (INDEPENDENT_AMBULATORY_CARE_PROVIDER_SITE_OTHER): Payer: 59 | Admitting: Nurse Practitioner

## 2018-12-02 ENCOUNTER — Other Ambulatory Visit (INDEPENDENT_AMBULATORY_CARE_PROVIDER_SITE_OTHER): Payer: 59

## 2018-12-02 VITALS — BP 160/90 | HR 66 | Ht 62.0 in | Wt 151.0 lb

## 2018-12-02 DIAGNOSIS — E119 Type 2 diabetes mellitus without complications: Secondary | ICD-10-CM

## 2018-12-02 DIAGNOSIS — Z720 Tobacco use: Secondary | ICD-10-CM

## 2018-12-02 DIAGNOSIS — Z794 Long term (current) use of insulin: Secondary | ICD-10-CM

## 2018-12-02 DIAGNOSIS — I10 Essential (primary) hypertension: Secondary | ICD-10-CM

## 2018-12-02 DIAGNOSIS — E785 Hyperlipidemia, unspecified: Secondary | ICD-10-CM

## 2018-12-02 LAB — LIPID PANEL
Cholesterol: 248 mg/dL — ABNORMAL HIGH (ref 0–200)
HDL: 34.2 mg/dL — AB (ref >39.00)
Total CHOL/HDL Ratio: 7

## 2018-12-02 LAB — HEMOGLOBIN A1C: Hgb A1c MFr Bld: 7.7 % — ABNORMAL HIGH (ref 4.6–6.5)

## 2018-12-02 LAB — CBC
HEMATOCRIT: 47.7 % — AB (ref 36.0–46.0)
Hemoglobin: 16.7 g/dL — ABNORMAL HIGH (ref 12.0–15.0)
MCHC: 35 g/dL (ref 30.0–36.0)
MCV: 90.9 fl (ref 78.0–100.0)
PLATELETS: 162 10*3/uL (ref 150.0–400.0)
RBC: 5.25 Mil/uL — ABNORMAL HIGH (ref 3.87–5.11)
RDW: 13.5 % (ref 11.5–15.5)
WBC: 10.1 10*3/uL (ref 4.0–10.5)

## 2018-12-02 LAB — COMPREHENSIVE METABOLIC PANEL
ALT: 18 U/L (ref 0–35)
AST: 17 U/L (ref 0–37)
Albumin: 4.5 g/dL (ref 3.5–5.2)
Alkaline Phosphatase: 105 U/L (ref 39–117)
BUN: 7 mg/dL (ref 6–23)
CALCIUM: 10.3 mg/dL (ref 8.4–10.5)
CHLORIDE: 102 meq/L (ref 96–112)
CO2: 30 meq/L (ref 19–32)
CREATININE: 0.67 mg/dL (ref 0.40–1.20)
GFR: 95.6 mL/min (ref >60.00)
Glucose, Bld: 187 mg/dL — ABNORMAL HIGH (ref 70–99)
POTASSIUM: 4.1 meq/L (ref 3.5–5.1)
Sodium: 139 mEq/L (ref 135–145)
Total Bilirubin: 0.4 mg/dL (ref 0.2–1.2)
Total Protein: 7.5 g/dL (ref 6.0–8.3)

## 2018-12-02 LAB — LDL CHOLESTEROL, DIRECT: LDL DIRECT: 106 mg/dL

## 2018-12-02 MED ORDER — LISINOPRIL 20 MG PO TABS: 20.0000 mg | ORAL_TABLET | Freq: Every day | ORAL | 1 refills | Status: AC

## 2018-12-02 NOTE — Assessment & Plan Note (Signed)
BP is elevated off medication, refill sent with instructions to resume RTC in 3-4 weeks for F/U, recheck BP, BMET - Comprehensive metabolic panel; Future - CBC; Future - Lipid panel; Future - lisinopril (PRINIVIL,ZESTRIL) 20 MG tablet; Take 1 tablet (20 mg total) by mouth daily.  Dispense: 90 tablet; Refill: 1

## 2018-12-02 NOTE — Patient Instructions (Signed)
Head downstairs for lab work today  I will see you back in 3-4 weeks for follow up.   Mediterranean Diet A Mediterranean diet refers to food and lifestyle choices that are based on the traditions of countries located on the [place]. This way of eating has been shown to help prevent certain conditions and improve outcomes for people who have chronic diseases, like kidney disease and heart disease. What are tips for following this plan? Lifestyle  Cook and eat meals together with your family, when possible.  Drink enough fluid to keep your urine clear or pale yellow.  Be physically active every day. This includes: ? Aerobic exercise like running or swimming. ? Leisure activities like gardening, walking, or housework.  Get 7-8 hours of sleep each night.  If recommended by your health care provider, drink red wine in moderation. This means 1 glass a day for nonpregnant women and 2 glasses a day for men. A glass of wine equals 5 oz (150 mL). Reading food labels   Check the serving size of packaged foods. For foods such as rice and pasta, the serving size refers to the amount of cooked product, not dry.  Check the total fat in packaged foods. Avoid foods that have saturated fat or trans fats.  Check the ingredients list for added sugars, such as corn syrup. Shopping  At the grocery store, buy most of your food from the areas near the walls of the store. This includes: ? Fresh fruits and vegetables (produce). ? Grains, beans, nuts, and seeds. Some of these may be available in unpackaged forms or large amounts (in bulk). ? Fresh seafood. ? Poultry and eggs. ? Low-fat dairy products.  Buy whole ingredients instead of prepackaged foods.  Buy fresh fruits and vegetables in-season from local farmers markets.  Buy frozen fruits and vegetables in resealable bags.  If you do not have access to quality fresh seafood, buy precooked frozen shrimp or canned fish, such as tuna,  salmon, or sardines.  Buy small amounts of raw or cooked vegetables, salads, or olives from the deli or salad bar at your store.  Stock your pantry so you always have certain foods on hand, such as olive oil, canned tuna, canned tomatoes, rice, pasta, and beans. Cooking  Cook foods with extra-virgin olive oil instead of using butter or other vegetable oils.  Have meat as a side dish, and have vegetables or grains as your main dish. This means having meat in small portions or adding small amounts of meat to foods like pasta or stew.  Use beans or vegetables instead of meat in common dishes like chili or lasagna.  Experiment with different cooking methods. Try roasting or broiling vegetables instead of steaming or sauteing them.  Add frozen vegetables to soups, stews, pasta, or rice.  Add nuts or seeds for added healthy fat at each meal. You can add these to yogurt, salads, or vegetable dishes.  Marinate fish or vegetables using olive oil, lemon juice, garlic, and fresh herbs. Meal planning   Plan to eat 1 vegetarian meal one day each week. Try to work up to 2 vegetarian meals, if possible.  Eat seafood 2 or more times a week.  Have healthy snacks readily available, such as: ? Vegetable sticks with hummus. ? [place] yogurt. ? Fruit and nut trail mix.  Eat balanced meals throughout the week. This includes: ? Fruit: 2-3 servings a day ? Vegetables: 4-5 servings a day ? Low-fat dairy: 2 servings a day ?  Fish, poultry, or lean meat: 1 serving a day ? Beans and legumes: 2 or more servings a week ? Nuts and seeds: 1-2 servings a day ? Whole grains: 6-8 servings a day ? Extra-virgin olive oil: 3-4 servings a day  Limit red meat and sweets to only a few servings a month What are my food choices?  Mediterranean diet ? Recommended ? Grains: Whole-grain pasta. Brown rice. Bulgar wheat. Polenta. Couscous. Whole-wheat bread. [place]. ? Vegetables: Artichokes. Beets. Broccoli.  Cabbage. Carrots. Eggplant. Green beans. Chard. Kale. Spinach. Onions. Leeks. Peas. Squash. Tomatoes. Peppers. Radishes. ? Fruits: Apples. Apricots. Avocado. Berries. Bananas. Cherries. Dates. Figs. Grapes. Lemons. Melon. Oranges. Peaches. Plums. Pomegranate. ? Meats and other protein foods: Beans. Almonds. Sunflower seeds. Pine nuts. Peanuts. [place]. Salmon. Scallops. Shrimp. [place]. Tilapia. Clams. Oysters. Eggs. ? Dairy: Low-fat milk. Cheese. Greek yogurt. ? Beverages: Water. Red wine. Herbal tea. ? Fats and oils: Extra virgin olive oil. Avocado oil. Grape seed oil. ? Sweets and desserts: [place] yogurt with honey. Baked apples. Poached pears. Trail mix. ? Seasoning and other foods: Basil. Cilantro. Coriander. Cumin. Mint. Parsley. Sage. Rosemary. Tarragon. Garlic. Oregano. Thyme. Pepper. Balsalmic vinegar. Tahini. Hummus. Tomato sauce. Olives. Mushrooms. ? Limit these ? Grains: Prepackaged pasta or rice dishes. Prepackaged cereal with added sugar. ? Vegetables: Deep fried potatoes (french fries). ? Fruits: Fruit canned in syrup. ? Meats and other protein foods: Beef. Pork. Lamb. Poultry with skin. Hot dogs. [place] Salines. ? Dairy: Ice cream. Sour cream. Whole milk. ? Beverages: Juice. Sugar-sweetened soft drinks. Beer. Liquor and spirits. ? Fats and oils: Butter. Canola oil. Vegetable oil. Beef fat (tallow). Lard. ? Sweets and desserts: Cookies. Cakes. Pies. Candy. ? Seasoning and other foods: Mayonnaise. Premade sauces and marinades. ? The items listed may not be a complete list. Talk with your dietitian about what dietary choices are right for you. Summary  The Mediterranean diet includes both food and lifestyle choices.  Eat a variety of fresh fruits and vegetables, beans, nuts, seeds, and whole grains.  Limit the amount of red meat and sweets that you eat.  Talk with your health care provider about whether it is safe for you to drink red wine in moderation. This means 1 glass a day for  nonpregnant women and 2 glasses a day for men. A glass of wine equals 5 oz (150 mL). This information is not intended to replace advice given to you by your health care provider. Make sure you discuss any questions you have with your health care provider. Document Released: 06/29/2016 Document Revised: 08/01/2016 Document Reviewed: 06/29/2016 Elsevier Interactive Patient Education  2019 [employer].

## 2018-12-02 NOTE — Assessment & Plan Note (Signed)
Encouraged to quit smoking, she is not ready to quit at this time

## 2018-12-02 NOTE — Progress Notes (Signed)
Deborah Wiggins is a 58 y.o. female with the following history as recorded in [employer]:  Patient Active Problem List   Diagnosis Date Noted  . Cardiac arrhythmia 12/08/2017  . Sciatica 08/17/2017  . Acute left-sided back pain 11/23/2016  . Blood tests for routine general physical examination 09/03/2015  . Routine general medical examination at a health care facility 02/05/2015  . Hypertension   . Personal history of noncompliance with medical treatment 10/31/2012  . Hyperlipidemia 09/23/2010  . GERD 08/05/2010  . Type 2 diabetes mellitus (HCC) 06/30/2010  . TOBACCO USE, QUIT 04/15/2010  . OBESITY 07/25/2009  . SCOLIOSIS 07/25/2009    Current Outpatient Medications  Medication Sig Dispense Refill  . B Complex-Biotin-FA (VITAMIN B50 COMPLEX PO) Take by mouth.    Marland Kitchen. glucose blood (ONE TOUCH ULTRA TEST) test strip 1 each by Other route 2 (two) times daily. Use to check blood sugars twice a day Dx E11.9 100 each 3  . Insulin Glargine (BASAGLAR KWIKPEN) 100 UNIT/ML SOPN Inject 0.4 mLs (40 Units total) into the skin daily. 11 mL 1  . Insulin Pen Needle (B-D ULTRAFINE III SHORT PEN) 31G X 8 MM MISC Use 1 needle per injection. Use needle to inject insulin 1-4 times daily as instructed. 90 each 0  . magnesium gluconate (MAGONATE) 500 MG tablet Take 500 mg by mouth 2 (two) times daily.    . ONE TOUCH LANCETS MISC 1 each by Does not apply route 2 (two) times daily.      Marland Kitchen. zolpidem (AMBIEN) 10 MG tablet Take 10 mg by mouth at bedtime.    Marland Kitchen. lisinopril (PRINIVIL,ZESTRIL) 20 MG tablet Take 1 tablet (20 mg total) by mouth daily. 90 tablet 1   No current facility-administered medications for this visit.     Allergies: Influenza vaccines; Metformin and related; and Prednisone  Past Medical History:  Diagnosis Date  . Diabetes mellitus    Type II  . Hyperlipidemia    Mixed  . Obesity   . Scoliosis 1977   remote harrington rod surgery    Past Surgical History:  Procedure Laterality Date  .  CHOLECYSTECTOMY      Family History  Problem Relation Age of Onset  . Dementia Mother        mild-mod  . Heart disease Father        CAD    Social History   Tobacco Use  . Smoking status: Current Every Day Smoker    Packs/day: 1.00    Types: Cigarettes    Last attempt to quit: 06/20/2009    Years since quitting: 9.4  . Smokeless tobacco: Never Used  . Tobacco comment: Regular Exercise - No  Substance Use Topics  . Alcohol use: Yes    Comment: Occasional     Subjective:  Ms Deborah HudsonMoser is here today requesting refill of all of her daily medications, says she has been out of ALL of her daily medications for at least 1 month. I last saw her on 12/07/17 and asked her to return in 1 month for follow up of diabetes, elevated a1c noted on 12/07/17 labs, but she did not return until today. She says she has been busy caring for her mother with advanced alzheimers dementia, has moved in with her mother to care for her and spends all of her time off work caring for mother, so has been unable to find time to make appointment for herself. She was on wellbutrin for smoking cessation last year, however never stopped smoking  and does not want to resume this medication, has been out for at least 1 month  Hypertension -maintained on lisinopril 20 for some time, out for about a month now. Reports she does not check BP readings at home No headaches, vision changes, chest pain, shortness of breath, edema.  BP Readings from Last 3 Encounters:  12/02/18 (!) 160/90  12/07/17 128/72  08/17/17 (!) 168/94    Diabetes- was taking ozempic for about 1 month after last OV in January but then ran out due to no refills, so she has been off all diabetic agents for almost a year now. Reports she has checked blood sugar occasionally since last visit, readings 110s. Denies tremor, diaphoresis, polyuria, polydipsia, polyphagia.  Lab Results  Component Value Date   HGBA1C 8.7 (H) 12/07/2017     ROS- See  HPI  Objective:  Vitals:   12/02/18 1517  BP: (!) 160/90  Pulse: 66  SpO2: 97%  Weight: 151 lb (68.5 kg)  Height: 5\' 2"  (1.575 m)    General: Well developed, well nourished, in no acute distress  Skin : Warm and dry.  Head: Normocephalic and atraumatic  Eyes: Sclera and conjunctiva clear; pupils round and reactive to light; extraocular movements intact  Oropharynx: Pink, supple. No suspicious lesions  Neck: Supple without thyromegaly, adenopathy  Lungs: Effort unlabored, no respiratory distress CVS exam: normal rate and regular rhythm.  Extremities: No edema, cyanosis, clubbing  Vessels: Symmetric bilaterally  Neurologic: Alert and oriented; speech intact; face symmetrical; moves all extremities well; CNII-XII intact without focal deficit  Psychiatric: Normal mood and affect.  Assessment:  1. Essential hypertension   2. Tobacco user   3. Hyperlipidemia, unspecified hyperlipidemia type   4. Type 2 diabetes mellitus without complication, with long-term current use of insulin (HCC)     Plan:   Discussed the role of healthy diet, mediterranean diet in the management of HTN, HLD, DM and printed additional information on AVS Return in about 1 week (around 12/09/2018) for F/U: HTN- resuming lisinopril- recheck BP, BMET.  Orders Placed This Encounter  Procedures  . Hemoglobin A1c    Standing Status:   Future    Number of Occurrences:   1    Standing Expiration Date:   12/03/2019  . Comprehensive metabolic panel    Standing Status:   Future    Number of Occurrences:   1    Standing Expiration Date:   12/03/2019  . CBC    Standing Status:   Future    Number of Occurrences:   1    Standing Expiration Date:   12/03/2019  . Lipid panel    Standing Status:   Future    Number of Occurrences:   1    Standing Expiration Date:   12/03/2019    Requested Prescriptions   Signed Prescriptions Disp Refills  . lisinopril (PRINIVIL,ZESTRIL) 20 MG tablet 90 tablet 1    Sig: Take 1 tablet  (20 mg total) by mouth daily.

## 2018-12-02 NOTE — Assessment & Plan Note (Signed)
No recent A1c, off all medications Will update labs prior to resuming medications F/U with further recommendations pending lab results= - Hemoglobin A1c; Future - Comprehensive metabolic panel; Future - CBC; Future - Lipid panel; Future

## 2018-12-02 NOTE — Assessment & Plan Note (Signed)
Not currently on statin Lipid panel overdue, she is fasting, will update labs and F/U with further recommendations pending lab results - Comprehensive metabolic panel; Future - CBC; Future - Lipid panel; Future

## 2018-12-06 ENCOUNTER — Other Ambulatory Visit: Payer: Self-pay | Admitting: Nurse Practitioner

## 2018-12-06 DIAGNOSIS — E785 Hyperlipidemia, unspecified: Secondary | ICD-10-CM

## 2018-12-06 DIAGNOSIS — Z794 Long term (current) use of insulin: Secondary | ICD-10-CM

## 2018-12-06 DIAGNOSIS — E119 Type 2 diabetes mellitus without complications: Secondary | ICD-10-CM

## 2018-12-06 MED ORDER — ROSUVASTATIN CALCIUM 10 MG PO TABS: 10.0000 mg | ORAL_TABLET | Freq: Every day | ORAL | 1 refills | Status: DC

## 2018-12-06 MED ORDER — DULAGLUTIDE 0.75 MG/0.5ML ~~LOC~~ SOAJ: 0.7500 mg | SUBCUTANEOUS | 1 refills | Status: DC

## 2018-12-10 ENCOUNTER — Telehealth: Payer: Self-pay | Admitting: *Deleted

## 2018-12-10 DIAGNOSIS — E785 Hyperlipidemia, unspecified: Secondary | ICD-10-CM

## 2018-12-10 NOTE — Telephone Encounter (Signed)
[facility] stating "patient has coupon for brand name Crestor. Please call [phone] for PA. I called and initiated PA. Representative I spoke to states it has been sent for clinical review and we will receive a call/fax from insurance with determination.   Also, The patient is requesting a cheaper alternative for Trulicity 0.75 mg. Per pharmacy her copay is $172.03. Please advise.

## 2018-12-12 NOTE — Telephone Encounter (Signed)
Per [facility]- Crestor PA is denied. Patient needs to try/fail Pravastatin and all other statins. Can we send pravastatin?

## 2018-12-16 MED ORDER — PRAVASTATIN SODIUM 40 MG PO TABS: 40.0000 mg | ORAL_TABLET | Freq: Every day | ORAL | 3 refills | Status: AC

## 2018-12-16 NOTE — Telephone Encounter (Signed)
I have sent pravastatin prescription for her Also after reviewing her chart/labs, she has had uncontrolled cholesterol readings for many years, I would like for her to see a lipid specialist, this referral has been placed

## 2018-12-16 NOTE — Telephone Encounter (Signed)
Pt informed of below. She states she has filled the rosuvastatin. She will continue with that. I advised her to not fill the pravastatin if she desires to continue generic crestor.   She is also requesting a rx for wellbutrin for smoking cessation. And a cheaper alternative for Trulicity. Please advise.

## 2018-12-23 MED ORDER — REPAGLINIDE 1 MG PO TABS: 1.0000 mg | ORAL_TABLET | Freq: Three times a day (TID) | ORAL | 2 refills | Status: AC

## 2018-12-23 NOTE — Telephone Encounter (Signed)
Called pt to advise of below. No answer/unable to leave vm. Will try again later. CRM created in case patient calls back.  

## 2018-12-23 NOTE — Telephone Encounter (Signed)
I have sent a medication called prandin for her diabetes, she will take 1mg  three times daily before meals, this medication MUST be taken with a meal as it will cause a decrease in her blood sugar. We will hold on all other diabetes medications for now, she will just take prandin. This should be affordable. Please schedule OV for 3 month follow up, to recheck her A1c and check response to medicine, or follow up sooner for readings <90 or >200. We will work on smoking cessation once we get her other conditions better controlled, as I do not want to start too many new medications at once

## 2018-12-24 NOTE — Telephone Encounter (Signed)
Pt informed of below and verbalized understanding. I advised her to keep ROV on 01/09/19 for BP recheck.

## 2019-01-09 ENCOUNTER — Ambulatory Visit: Payer: Self-pay | Admitting: Nurse Practitioner

## 2019-01-09 ENCOUNTER — Encounter: Payer: Self-pay | Admitting: Nurse Practitioner

## 2019-01-15 ENCOUNTER — Ambulatory Visit: Payer: Self-pay | Admitting: Nurse Practitioner

## 2019-01-17 ENCOUNTER — Ambulatory Visit (INDEPENDENT_AMBULATORY_CARE_PROVIDER_SITE_OTHER): Payer: 59 | Admitting: Nurse Practitioner

## 2019-01-17 ENCOUNTER — Encounter: Payer: Self-pay | Admitting: Nurse Practitioner

## 2019-01-17 ENCOUNTER — Ambulatory Visit (INDEPENDENT_AMBULATORY_CARE_PROVIDER_SITE_OTHER)
Admission: RE | Admit: 2019-01-17 | Discharge: 2019-01-17 | Disposition: A | Discharge status: Home / Self Care | Payer: 59 | Source: Ambulatory Visit | Attending: Nurse Practitioner | Admitting: Nurse Practitioner

## 2019-01-17 VITALS — BP 140/72 | HR 68 | Ht 62.0 in | Wt 149.0 lb

## 2019-01-17 DIAGNOSIS — I10 Essential (primary) hypertension: Secondary | ICD-10-CM | POA: Diagnosis not present

## 2019-01-17 DIAGNOSIS — M25511 Pain in right shoulder: Secondary | ICD-10-CM

## 2019-01-17 DIAGNOSIS — E785 Hyperlipidemia, unspecified: Secondary | ICD-10-CM | POA: Diagnosis not present

## 2019-01-17 DIAGNOSIS — E119 Type 2 diabetes mellitus without complications: Secondary | ICD-10-CM

## 2019-01-17 DIAGNOSIS — Z794 Long term (current) use of insulin: Secondary | ICD-10-CM

## 2019-01-17 NOTE — Patient Instructions (Addendum)
Head downstairs for labs/xray  Return in about 2 months for diabetes follow up   Mediterranean Diet A Mediterranean diet refers to food and lifestyle choices that are based on the traditions of countries located on the [place]. This way of eating has been shown to help prevent certain conditions and improve outcomes for people who have chronic diseases, like kidney disease and heart disease. What are tips for following this plan? Lifestyle  Cook and eat meals together with your family, when possible.  Drink enough fluid to keep your urine clear or pale yellow.  Be physically active every day. This includes: ? Aerobic exercise like running or swimming. ? Leisure activities like gardening, walking, or housework.  Get 7-8 hours of sleep each night.  If recommended by your health care provider, drink red wine in moderation. This means 1 glass a day for nonpregnant women and 2 glasses a day for men. A glass of wine equals 5 oz (150 mL). Reading food labels   Check the serving size of packaged foods. For foods such as rice and pasta, the serving size refers to the amount of cooked product, not dry.  Check the total fat in packaged foods. Avoid foods that have saturated fat or trans fats.  Check the ingredients list for added sugars, such as corn syrup. Shopping  At the grocery store, buy most of your food from the areas near the walls of the store. This includes: ? Fresh fruits and vegetables (produce). ? Grains, beans, nuts, and seeds. Some of these may be available in unpackaged forms or large amounts (in bulk). ? Fresh seafood. ? Poultry and eggs. ? Low-fat dairy products.  Buy whole ingredients instead of prepackaged foods.  Buy fresh fruits and vegetables in-season from local farmers markets.  Buy frozen fruits and vegetables in resealable bags.  If you do not have access to quality fresh seafood, buy precooked frozen shrimp or canned fish, such as tuna, salmon,  or sardines.  Buy small amounts of raw or cooked vegetables, salads, or olives from the deli or salad bar at your store.  Stock your pantry so you always have certain foods on hand, such as olive oil, canned tuna, canned tomatoes, rice, pasta, and beans. Cooking  Cook foods with extra-virgin olive oil instead of using butter or other vegetable oils.  Have meat as a side dish, and have vegetables or grains as your main dish. This means having meat in small portions or adding small amounts of meat to foods like pasta or stew.  Use beans or vegetables instead of meat in common dishes like chili or lasagna.  Experiment with different cooking methods. Try roasting or broiling vegetables instead of steaming or sauteing them.  Add frozen vegetables to soups, stews, pasta, or rice.  Add nuts or seeds for added healthy fat at each meal. You can add these to yogurt, salads, or vegetable dishes.  Marinate fish or vegetables using olive oil, lemon juice, garlic, and fresh herbs. Meal planning   Plan to eat 1 vegetarian meal one day each week. Try to work up to 2 vegetarian meals, if possible.  Eat seafood 2 or more times a week.  Have healthy snacks readily available, such as: ? Vegetable sticks with hummus. ? [place] yogurt. ? Fruit and nut trail mix.  Eat balanced meals throughout the week. This includes: ? Fruit: 2-3 servings a day ? Vegetables: 4-5 servings a day ? Low-fat dairy: 2 servings a day ? Fish, poultry, or  lean meat: 1 serving a day ? Beans and legumes: 2 or more servings a week ? Nuts and seeds: 1-2 servings a day ? Whole grains: 6-8 servings a day ? Extra-virgin olive oil: 3-4 servings a day  Limit red meat and sweets to only a few servings a month What are my food choices?  Mediterranean diet ? Recommended ? Grains: Whole-grain pasta. Brown rice. Bulgar wheat. Polenta. Couscous. Whole-wheat bread. [place]. ? Vegetables: Artichokes. Beets. Broccoli. Cabbage.  Carrots. Eggplant. Green beans. Chard. Kale. Spinach. Onions. Leeks. Peas. Squash. Tomatoes. Peppers. Radishes. ? Fruits: Apples. Apricots. Avocado. Berries. Bananas. Cherries. Dates. Figs. Grapes. Lemons. Melon. Oranges. Peaches. Plums. Pomegranate. ? Meats and other protein foods: Beans. Almonds. Sunflower seeds. Pine nuts. Peanuts. Cod. Salmon. Scallops. Shrimp. Tuna. Tilapia. Clams. Oysters. Eggs. ? Dairy: Low-fat milk. Cheese. Greek yogurt. ? Beverages: Water. Red wine. Herbal tea. ? Fats and oils: Extra virgin olive oil. Avocado oil. Grape seed oil. ? Sweets and desserts: [place] yogurt with honey. Baked apples. Poached pears. Trail mix. ? Seasoning and other foods: Basil. Cilantro. Coriander. Cumin. Mint. Parsley. Sage. Rosemary. Tarragon. Garlic. Oregano. Thyme. Pepper. Balsalmic vinegar. Tahini. Hummus. Tomato sauce. Olives. Mushrooms. ? Limit these ? Grains: Prepackaged pasta or rice dishes. Prepackaged cereal with added sugar. ? Vegetables: Deep fried potatoes (french fries). ? Fruits: Fruit canned in syrup. ? Meats and other protein foods: Beef. Pork. Lamb. Poultry with skin. Hot dogs. [place] Blase. ? Dairy: Ice cream. Sour cream. Whole milk. ? Beverages: Juice. Sugar-sweetened soft drinks. Beer. Liquor and spirits. ? Fats and oils: Butter. Canola oil. Vegetable oil. Beef fat (tallow). Lard. ? Sweets and desserts: Cookies. Cakes. Pies. Candy. ? Seasoning and other foods: Mayonnaise. Premade sauces and marinades. ? The items listed may not be a complete list. Talk with your dietitian about what dietary choices are right for you. Summary  The Mediterranean diet includes both food and lifestyle choices.  Eat a variety of fresh fruits and vegetables, beans, nuts, seeds, and whole grains.  Limit the amount of red meat and sweets that you eat.  Talk with your health care provider about whether it is safe for you to drink red wine in moderation. This means 1 glass a day for nonpregnant women  and 2 glasses a day for men. A glass of wine equals 5 oz (150 mL). This information is not intended to replace advice given to you by your health care provider. Make sure you discuss any questions you have with your health care provider. Document Released: 06/29/2016 Document Revised: 08/01/2016 Document Reviewed: 06/29/2016 Elsevier Interactive Patient Education  2019 [employer].

## 2019-01-17 NOTE — Assessment & Plan Note (Signed)
Continue prandin Encouraged to begin recording glucose readings at home RTC in about 2 months for F/U- recheck A1c Additional education ON AVS

## 2019-01-17 NOTE — Assessment & Plan Note (Signed)
Stable, on higher end of normal Continue lisinopril at current dosage RTC in about 2 months for F/U- can consider increasing lisinopril or adding second agent if BP readings remain higher end of normal Additional education ON AVS - Basic metabolic panel; Future

## 2019-01-17 NOTE — Assessment & Plan Note (Signed)
We discussed treatment of HLD, she does not want to take a statin Her cholesterol is very elevated I would like her to continue with planned cardiology referral. She says she has the cardiology number and will schedule appointment Additional education ON AVS

## 2019-01-17 NOTE — Progress Notes (Signed)
Deborah Wiggins is a 58 y.o. female with the following history as recorded in [employer]:  Patient Active Problem List   Diagnosis Date Noted  . Cardiac arrhythmia 12/08/2017  . Sciatica 08/17/2017  . Acute left-sided back pain 11/23/2016  . Blood tests for routine general physical examination 09/03/2015  . Routine general medical examination at a health care facility 02/05/2015  . Hypertension   . Personal history of noncompliance with medical treatment 10/31/2012  . Hyperlipidemia 09/23/2010  . GERD 08/05/2010  . Type 2 diabetes mellitus (HCC) 06/30/2010  . TOBACCO USE, QUIT 04/15/2010  . OBESITY 07/25/2009  . SCOLIOSIS 07/25/2009    Current Outpatient Medications  Medication Sig Dispense Refill  . B Complex-Biotin-FA (VITAMIN B50 COMPLEX PO) Take by mouth.    Marland Kitchen glucose blood (ONE TOUCH ULTRA TEST) test strip 1 each by Other route 2 (two) times daily. Use to check blood sugars twice a day Dx E11.9 100 each 3  . Insulin Pen Needle (B-D ULTRAFINE III SHORT PEN) 31G X 8 MM MISC Use 1 needle per injection. Use needle to inject insulin 1-4 times daily as instructed. 90 each 0  . lisinopril (PRINIVIL,ZESTRIL) 20 MG tablet Take 1 tablet (20 mg total) by mouth daily. 90 tablet 1  . magnesium gluconate (MAGONATE) 500 MG tablet Take 500 mg by mouth 2 (two) times daily.    . ONE TOUCH LANCETS MISC 1 each by Does not apply route 2 (two) times daily.      . repaglinide (PRANDIN) 1 MG tablet Take 1 tablet (1 mg total) by mouth 3 (three) times daily before meals. 90 tablet 2  . zolpidem (AMBIEN) 10 MG tablet Take 10 mg by mouth at bedtime.    . pravastatin (PRAVACHOL) 40 MG tablet Take 1 tablet (40 mg total) by mouth daily. (Patient not taking: Reported on 01/17/2019) 30 tablet 3   No current facility-administered medications for this visit.     Allergies: Influenza vaccines; Metformin and related; and Prednisone  Past Medical History:  Diagnosis Date  . Diabetes mellitus    Type II  .  Hyperlipidemia    Mixed  . Obesity   . Scoliosis 1977   remote harrington rod surgery    Past Surgical History:  Procedure Laterality Date  . CHOLECYSTECTOMY      Family History  Problem Relation Age of Onset  . Dementia Mother        mild-mod  . Heart disease Father        CAD    Social History   Tobacco Use  . Smoking status: Current Every Day Smoker    Packs/day: 1.00    Types: Cigarettes    Last attempt to quit: 06/20/2009    Years since quitting: 9.5  . Smokeless tobacco: Never Used  . Tobacco comment: Regular Exercise - No  Substance Use Topics  . Alcohol use: Yes    Comment: Occasional     Subjective:  Deborah Wiggins is here today for HTN, DM At her last OV on 12/02/17, cholesterol was very elevated, she was sent pravastatin to start along with referral to cardiology lipid clinic, she tells me that she has not been taking statin, really doesn't want to take a statin, she has also not scheduled with lipid clinic yet although they have called her. She is also requesting evaluation of right shoulder pain today, which began about 1 month ago, now radiating down right arm to right elbow and sometimes notices numbness/tingling in her right 4th  and 5th digits. She denies any injuries, no fevers, chills, swelling, skin discoloration, weakness. At her Last OV on 12/02/17, she was instructed to resume lisinopril 20 daily after being off for some time, today she says she has resumed lisinopril daily as prescribed, no noted adverse effects, does not check BP readings at home. At her last OV on 12/02/17, A1c remained elevated, off meds, refill was  sent for trulicity, she called back to say she could not afford it, so prandin 1 TID was sent instead. She has a metformin allergy. She tells me that she is taking prandin daily as prescribed without noted adverse effects, however has not been checking glucose readings although she does have glucometer.   BP Readings from Last 3 Encounters:   01/17/19 140/72  12/02/18 (!) 160/90  12/07/17 128/72   Lab Results  Component Value Date   HGBA1C 7.7 (H) 12/02/2018   Denies weakness, headaches, vision changes, chest pain, shortness of breath, edema, weakness, tremor, diaphoresis, polydipsia, polyphagia.  ROS- See HPI  Objective:  Vitals:   01/17/19 1007  BP: 140/72  Pulse: 68  SpO2: 95%  Weight: 149 lb (67.6 kg)  Height: 5\' 2"  (1.575 m)    General: Well developed, well nourished, in no acute distress  Skin : Warm and dry.  Head: Normocephalic and atraumatic  Eyes: Sclera and conjunctiva clear; pupils round and reactive to light; extraocular movements intact  Oropharynx: Pink, supple. No suspicious lesions  Neck: Supple without thyromegaly Lungs: respirations unlabored, lung sounds clear bilaterally. CVS exam: normal rate and regular rhythm, s1 and s2 intact  Extremities: No edema, cyanosis, clubbing  Vessels: Symmetric bilaterally  Neurologic: Alert and oriented; speech intact; face symmetrical; moves all extremities well; CNII-XII intact without focal deficit  Psychiatric: Normal mood and affect.  Assessment:  1. Essential hypertension   2. Type 2 diabetes mellitus without complication, with long-term current use of insulin (HCC)   3. Hyperlipidemia, unspecified hyperlipidemia type   4. Acute pain of right shoulder     Plan:   Acute pain of right shoulder We discussed this briefly as she is here today for DM, HTN follow up Imaging ordered-F/U with further recommendations pending results - DG Shoulder Right; Future   Return in about 2 months (around 03/18/2019) for F/U: DM- recheck A1c; HTN- recheck BP.  Orders Placed This Encounter  Procedures  . DG Shoulder Right    Standing Status:   Future    Number of Occurrences:   1    Standing Expiration Date:   03/17/2020    Order Specific Question:   Reason for Exam (SYMPTOM  OR DIAGNOSIS REQUIRED)    Answer:   shoulder pain    Order Specific Question:   Is  patient pregnant?    Answer:   No    Order Specific Question:   Preferred imaging location?    Answer:   [facility]    Order Specific Question:   Radiology Contrast Protocol - do NOT remove file path    Answer:   [email or web address]  . Basic metabolic panel    Standing Status:   Future    Standing Expiration Date:   01/18/2020    Requested Prescriptions    No prescriptions requested or ordered in this encounter

## 2019-01-17 NOTE — Addendum Note (Signed)
Addended by: Merrilyn Puma on: 01/17/2019 10:59 AM   Modules accepted: Orders

## 2019-02-18 ENCOUNTER — Encounter

## 2019-03-20 ENCOUNTER — Ambulatory Visit: Payer: Self-pay | Admitting: Nurse Practitioner

## 2019-10-27 IMAGING — DX DG SHOULDER 2+V*R*
3 series · 3 of 3 positions shown · non-contrast
Comparison: None.

CLINICAL DATA: Right shoulder pain radiating down the right arm
with numbness in the fingers. No known injury.

EXAM:
RIGHT SHOULDER - 2+ VIEW

[grashey]
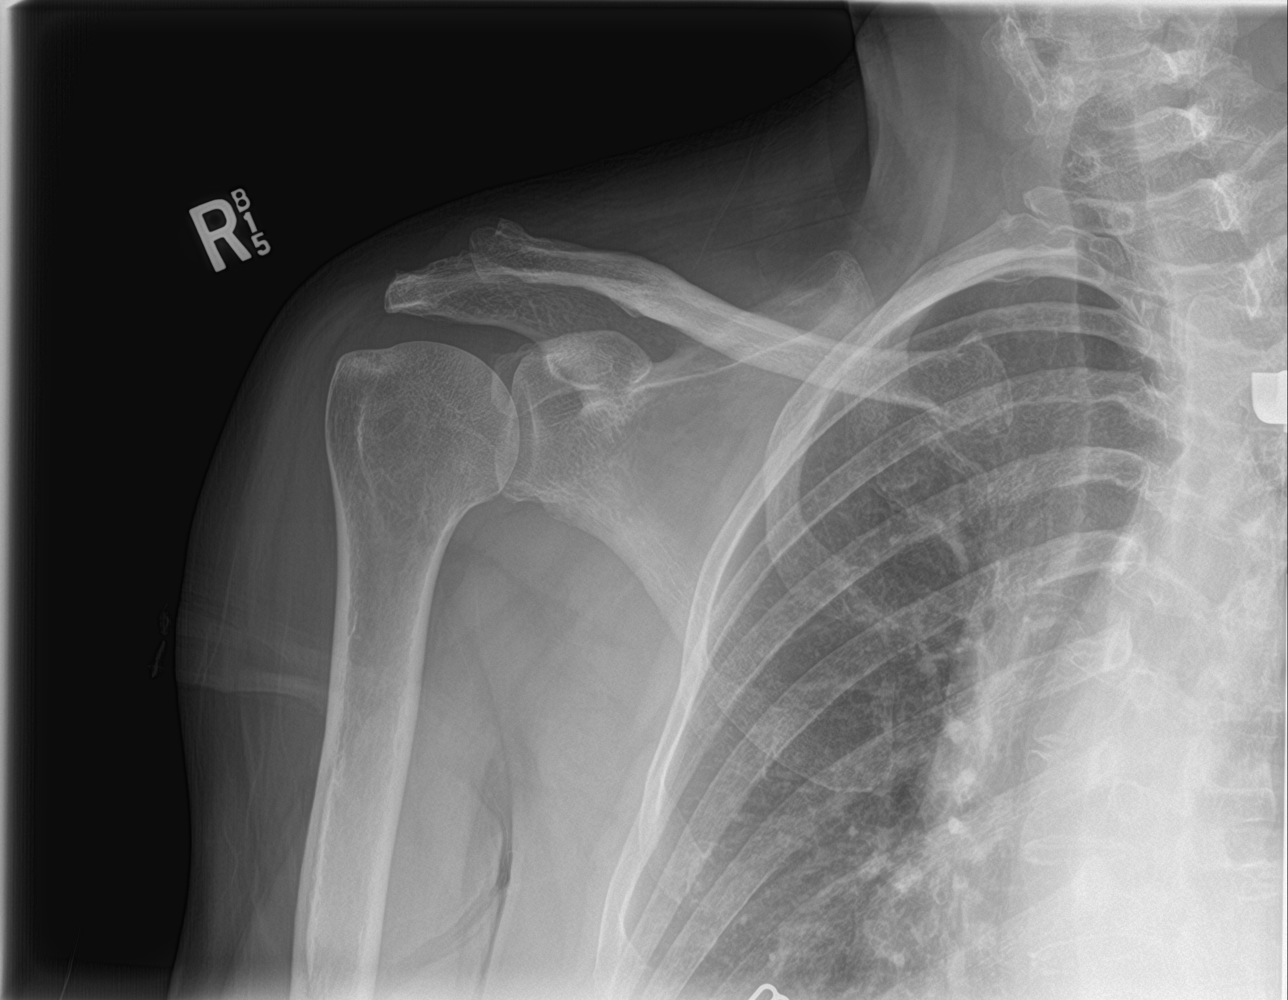

[y view]
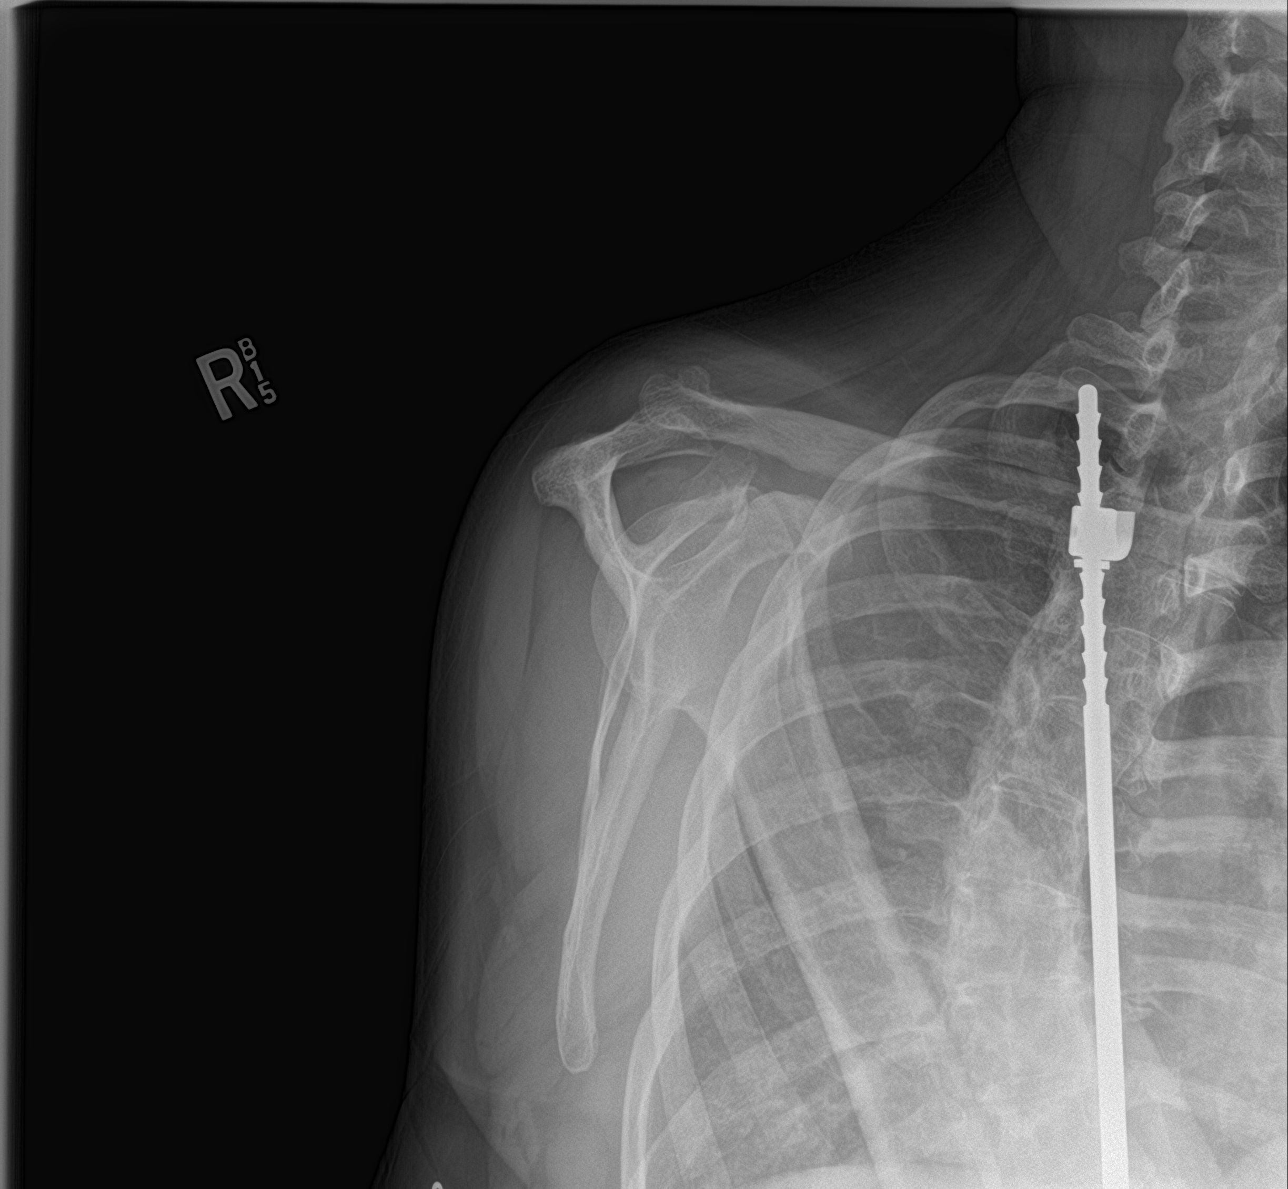

[shoulder axial]
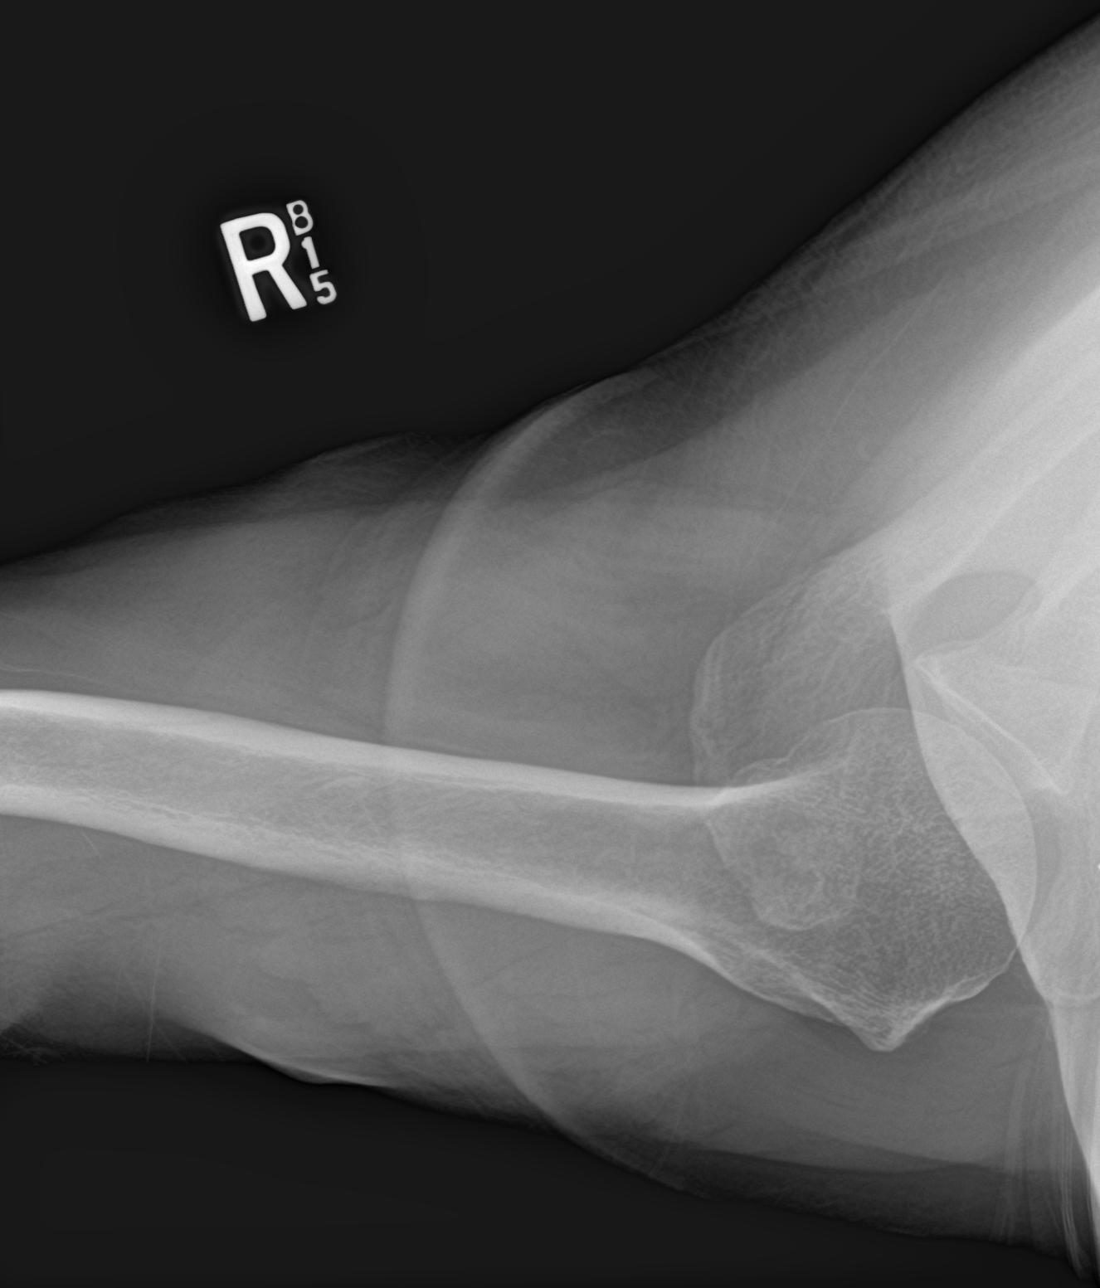

[3 of 3 positions shown; findings below may reference images not displayed]

FINDINGS: Normal appearing right shoulder. Thoracic spine scoliosis and
Harrington rod.
IMPRESSION: Normal appearing right shoulder.
